# Patient Record
Sex: Female | Born: 1975 | Race: White | Hispanic: No | Marital: Married | Smoking: Never smoker
Health system: Southern US, Community
[De-identification: ages and names within clinical notes are randomized; demographics above are authoritative.]

## PROBLEM LIST (undated history)

## (undated) DIAGNOSIS — G7000 Myasthenia gravis without (acute) exacerbation: Secondary | ICD-10-CM

## (undated) DIAGNOSIS — R87629 Unspecified abnormal cytological findings in specimens from vagina: Secondary | ICD-10-CM

## (undated) DIAGNOSIS — N6320 Unspecified lump in the left breast, unspecified quadrant: Secondary | ICD-10-CM

## (undated) DIAGNOSIS — Z86018 Personal history of other benign neoplasm: Secondary | ICD-10-CM

## (undated) HISTORY — DX: Unspecified lump in the left breast, unspecified quadrant: N63.20

## (undated) HISTORY — PX: BREAST BIOPSY: SHX20

## (undated) HISTORY — DX: Myasthenia gravis without (acute) exacerbation: G70.00

## (undated) HISTORY — DX: Unspecified abnormal cytological findings in specimens from vagina: R87.629

---
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
## 1997-05-07 HISTORY — PX: BREAST SURGERY: SHX581

## 1997-05-07 HISTORY — PX: BREAST EXCISIONAL BIOPSY: SUR124

## 2004-12-25 ENCOUNTER — Observation Stay: Payer: Self-pay | Admitting: Obstetrics and Gynecology

## 2005-01-12 ENCOUNTER — Inpatient Hospital Stay: Payer: Self-pay | Admitting: Obstetrics and Gynecology

## 2005-01-12 DIAGNOSIS — O321XX0 Maternal care for breech presentation, not applicable or unspecified: Secondary | ICD-10-CM

## 2005-05-07 DIAGNOSIS — G7000 Myasthenia gravis without (acute) exacerbation: Secondary | ICD-10-CM

## 2005-05-07 HISTORY — DX: Myasthenia gravis without (acute) exacerbation: G70.00

## 2005-05-07 HISTORY — PX: OTHER SURGICAL HISTORY: SHX169

## 2005-05-07 HISTORY — PX: THYMECTOMY: SHX1063

## 2005-05-15 ENCOUNTER — Ambulatory Visit: Payer: Self-pay

## 2005-05-21 ENCOUNTER — Ambulatory Visit: Payer: Self-pay | Admitting: Ophthalmology

## 2005-10-02 ENCOUNTER — Ambulatory Visit: Payer: Self-pay | Admitting: Internal Medicine

## 2007-01-16 ENCOUNTER — Encounter: Payer: Self-pay | Admitting: Obstetrics and Gynecology

## 2007-05-05 ENCOUNTER — Encounter: Payer: Self-pay | Admitting: Obstetrics and Gynecology

## 2007-06-12 ENCOUNTER — Encounter: Payer: Self-pay | Admitting: Maternal and Fetal Medicine

## 2007-06-19 ENCOUNTER — Observation Stay: Payer: Self-pay

## 2007-06-22 ENCOUNTER — Observation Stay: Payer: Self-pay

## 2007-06-23 ENCOUNTER — Observation Stay: Payer: Self-pay

## 2007-06-26 ENCOUNTER — Observation Stay: Payer: Self-pay

## 2007-06-27 ENCOUNTER — Ambulatory Visit: Payer: Self-pay | Admitting: Obstetrics and Gynecology

## 2007-06-29 ENCOUNTER — Inpatient Hospital Stay: Payer: Self-pay | Admitting: Obstetrics and Gynecology

## 2007-11-12 ENCOUNTER — Emergency Department: Payer: Self-pay | Admitting: Emergency Medicine

## 2007-11-12 ENCOUNTER — Other Ambulatory Visit: Payer: Self-pay

## 2008-03-03 ENCOUNTER — Ambulatory Visit: Payer: Self-pay | Admitting: Internal Medicine

## 2010-10-04 ENCOUNTER — Ambulatory Visit: Payer: Self-pay | Admitting: Family Medicine

## 2011-10-28 DIAGNOSIS — G7000 Myasthenia gravis without (acute) exacerbation: Secondary | ICD-10-CM | POA: Insufficient documentation

## 2012-04-17 DIAGNOSIS — M542 Cervicalgia: Secondary | ICD-10-CM | POA: Insufficient documentation

## 2012-04-17 DIAGNOSIS — M5412 Radiculopathy, cervical region: Secondary | ICD-10-CM | POA: Insufficient documentation

## 2012-11-25 ENCOUNTER — Ambulatory Visit: Payer: Self-pay | Admitting: Obstetrics and Gynecology

## 2012-12-03 ENCOUNTER — Ambulatory Visit (INDEPENDENT_AMBULATORY_CARE_PROVIDER_SITE_OTHER): Payer: 59 | Admitting: General Surgery

## 2012-12-03 ENCOUNTER — Encounter: Payer: Self-pay | Admitting: General Surgery

## 2012-12-03 VITALS — BP 120/74 | HR 76 | Resp 12 | Ht 63.0 in | Wt 147.0 lb

## 2012-12-03 DIAGNOSIS — N6009 Solitary cyst of unspecified breast: Secondary | ICD-10-CM | POA: Insufficient documentation

## 2012-12-03 DIAGNOSIS — N6002 Solitary cyst of left breast: Secondary | ICD-10-CM

## 2012-12-03 NOTE — Patient Instructions (Signed)
Call if any changes or worsening of symptoms.

## 2012-12-03 NOTE — Progress Notes (Signed)
Patient ID: Julie Skinner, female   DOB: October 23, 1975, 37 y.o.   MRN: [id number]  Chief Complaint  Patient presents with  . Other    mammogram    HPI Julie Skinner is a 37 y.o. female who presents for a breast evaluation. The most recent mammogram was done on 11/25/12. Patient does perform regular self breast checks . Patient felt some pain in the left breast about 2 weeks ago and was seen by her doctor for this. The breast was sore to the touch and felt somewhat denser. She denies any drainage from the breasts. She has a history of previous lump removed more than 15 years ago from the left breast that was benign.  Is no history of trauma.    HPI  Past Medical History  Diagnosis Date  . Myasthenia gravis 2007    Past Surgical History  Procedure Laterality Date  . Cesarean section  [id number]  . Breast surgery Left 1999    lump removed  . Thymectomy  2007    No family history on file.  Social History History  Substance Use Topics  . Smoking status: Never Smoker   . Smokeless tobacco: Never Used  . Alcohol Use: Yes    No Known Allergies  Current Outpatient Prescriptions  Medication Sig Dispense Refill  . cholecalciferol (VITAMIN D) 1000 UNITS tablet Take 1,000 Units by mouth daily.      . Cyanocobalamin (B-12) 100 MCG TABS Take 1 tablet by mouth daily.      . Multiple Vitamins-Minerals (MULTIVITAMIN WITH MINERALS) tablet Take 1 tablet by mouth daily.       No current facility-administered medications for this visit.    Review of Systems Review of Systems  Constitutional: Negative.   Respiratory: Negative.   Cardiovascular: Negative.     Blood pressure 120/74, pulse 76, resp. rate 12, height 5\' 3"  (1.6 m), weight 147 lb (66.679 kg).  Physical Exam Physical Exam  Constitutional: She is oriented to person, place, and time. She appears well-developed and well-nourished.  Neck: Neck supple.  Cardiovascular: Normal rate and regular rhythm.   Pulmonary/Chest:  Effort normal and breath sounds normal. Right breast exhibits no inverted nipple, no mass, no nipple discharge, no skin change and no tenderness. Left breast exhibits no inverted nipple, no mass, no nipple discharge, no skin change and no tenderness.  Lymphadenopathy:    She has no cervical adenopathy.  Neurological: She is alert and oriented to person, place, and time.    Data Reviewed Bilateral mammograms dated Oct 04, 2010 were reviewed. Moderately dense breasts. No mass or significant microcalcifications. BI-RAD-2. Bilateral mammogram dated November 25, 2012 showed heterogeneously dense breasts. Multiple small nodules were appreciated bilaterally. Focal spot compression views of the left breast showed a nodule with rounded borders which on retrospect appears unchanged from her 2012 exam. No areas of suspicious microcalcifications.  Ultrasound of the same date showed multiple cysts measuring up to 0.8 cm in diameter. A single hypoechoic nodule which was felt to be indeterminate and felt to correspond to the mammographic abnormality measuring 0.6 cm was noted at the 3:00 position.  The films were reviewed and the small nodular density in the lateral aspect of the left breast appears unchanged since 2012.  GYN note status November 25, 2012 were reviewed.  Assessment    Multiple small breast cyst, now asymptomatic. Small 6 mm nodule, likely fibroadenoma.     Plan    The rest pain which prompted the patient's assessment has essentially resolved  in the last 7-10 days. Her clinical exam is unremarkable. Mammogram showed no discernible change and a small nodular density.  Options for management were reviewed: 1) early biopsy versus 2) observation. At this time the patient is comfortable with observation. Should she have recurrent pain or desire early biopsy she'll contact the office. Otherwise, plans will be for followup in 6 months with plans for a repeat in office ultrasound at that time.that time.        Julie Skinner 12/03/2012, 9:52 PM

## 2012-12-17 ENCOUNTER — Ambulatory Visit: Payer: Self-pay

## 2013-06-11 ENCOUNTER — Ambulatory Visit: Payer: 59 | Admitting: General Surgery

## 2013-06-15 ENCOUNTER — Encounter: Payer: Self-pay | Admitting: General Surgery

## 2013-06-15 ENCOUNTER — Other Ambulatory Visit: Payer: 59

## 2013-06-15 ENCOUNTER — Ambulatory Visit (INDEPENDENT_AMBULATORY_CARE_PROVIDER_SITE_OTHER): Payer: 59 | Admitting: General Surgery

## 2013-06-15 VITALS — BP 124/84 | HR 76 | Resp 12 | Ht 63.0 in | Wt 156.0 lb

## 2013-06-15 DIAGNOSIS — N63 Unspecified lump in unspecified breast: Secondary | ICD-10-CM

## 2013-06-15 NOTE — Patient Instructions (Addendum)
Patient to return in 6 months for a follow up with bilateral diagnostic mammogram .

## 2013-06-15 NOTE — Progress Notes (Signed)
Patient ID: Julie Skinner, female   DOB: 11/07/1975, 38 y.o.   MRN: [id number]  Chief Complaint  Patient presents with  . Follow-up    breast ultrasound    HPI Julie Skinner is a 38 y.o. female here today for her six month left breast ultrasound. Patient states she is doing well.   HPI  Past Medical History  Diagnosis Date  . Myasthenia gravis 2007    Past Surgical History  Procedure Laterality Date  . Cesarean section  [id number]  . Breast surgery Left 1999    lump removed  . Thymectomy  2007    History reviewed. No pertinent family history.  Social History History  Substance Use Topics  . Smoking status: Never Smoker   . Smokeless tobacco: Never Used  . Alcohol Use: Yes    No Known Allergies  Current Outpatient Prescriptions  Medication Sig Dispense Refill  . cholecalciferol (VITAMIN D) 1000 UNITS tablet Take 1,000 Units by mouth daily.      . ciprofloxacin (CIPRO) 500 MG tablet Take 1 tablet by mouth daily.      . Cyanocobalamin (B-12) 100 MCG TABS Take 1 tablet by mouth daily.      Marland Kitchen escitalopram (LEXAPRO) 10 MG tablet Take 10 mg by mouth daily.      . metroNIDAZOLE (METROGEL) 0.75 % vaginal gel       . Multiple Vitamins-Minerals (MULTIVITAMIN WITH MINERALS) tablet Take 1 tablet by mouth daily.       No current facility-administered medications for this visit.    Review of Systems Review of Systems  Constitutional: Negative.   Respiratory: Negative.   Cardiovascular: Negative.     Blood pressure 124/84, pulse 76, resp. rate 12, height 5\' 3"  (1.6 m), weight 156 lb (70.761 kg).  Physical Exam Physical Exam  Constitutional: She is oriented to person, place, and time. She appears well-developed and well-nourished.  Neck: Neck supple. No thyromegaly present.  Cardiovascular: Normal rate, regular rhythm and normal heart sounds.   No murmur heard. Pulmonary/Chest: Effort normal and breath sounds normal. Right breast exhibits no inverted nipple, no  mass, no nipple discharge, no skin change and no tenderness. Left breast exhibits no inverted nipple, no mass, no nipple discharge, no skin change and no tenderness.  Lymphadenopathy:    She has no cervical adenopathy.    She has no axillary adenopathy.  Neurological: She is alert and oriented to person, place, and time.  Skin: Skin is warm and dry.    Data Reviewed Ultrasound examination of the left breast in the 3:00 position, 10 cm from nipple once again identifies a hypoechoic 0.5 x 0.64 x 0.81 cm lesion. No vascular flow was identified. Her previous hospital-based ultrasound suggested a maximum diameter of 0.6 cm.  Assessment    Solid nodule right breast, minimal interval change considering observer variance.     Plan    Options for management were reviewed: 1) careful followup with bilateral diagnostic mammograms an office ultrasound in 6 months versus 2) early vacuum-assisted biopsy. Pros and cons of each were reviewed. At this time the patient is comfortable with continued observation.        Robert Bellow 06/15/2013, 9:13 PM

## 2013-06-23 ENCOUNTER — Ambulatory Visit: Payer: Self-pay | Admitting: Obstetrics and Gynecology

## 2013-12-24 ENCOUNTER — Encounter: Payer: Self-pay | Admitting: General Surgery

## 2013-12-28 ENCOUNTER — Ambulatory Visit: Payer: 59 | Admitting: General Surgery

## 2013-12-30 ENCOUNTER — Ambulatory Visit (INDEPENDENT_AMBULATORY_CARE_PROVIDER_SITE_OTHER): Payer: 59 | Admitting: General Surgery

## 2013-12-30 ENCOUNTER — Other Ambulatory Visit: Payer: 59

## 2013-12-30 ENCOUNTER — Encounter: Payer: Self-pay | Admitting: General Surgery

## 2013-12-30 VITALS — BP 120/80 | HR 78 | Resp 12 | Ht 64.0 in | Wt 149.0 lb

## 2013-12-30 DIAGNOSIS — N6320 Unspecified lump in the left breast, unspecified quadrant: Secondary | ICD-10-CM

## 2013-12-30 DIAGNOSIS — N63 Unspecified lump in unspecified breast: Secondary | ICD-10-CM

## 2013-12-30 NOTE — Patient Instructions (Signed)

## 2013-12-30 NOTE — Progress Notes (Signed)
Patient ID: Julie Skinner, female   DOB: 1976/02/14, 38 y.o.   MRN: [id number]  Chief Complaint  Patient presents with  . Follow-up    mammogram    HPI Julie Skinner is a 38 y.o. female who presents for a breast evaluation. The most recent mammogram and left breast ultrasound was done on 12/22/13 at [facility].  Patient does perform regular self breast checks and gets regular mammograms done.    HPI  Past Medical History  Diagnosis Date  . Myasthenia gravis 2007    Past Surgical History  Procedure Laterality Date  . Cesarean section  [id number]  . Breast surgery Left 1999    lump removed  . Thymectomy  2007    No family history on file.  Social History History  Substance Use Topics  . Smoking status: Never Smoker   . Smokeless tobacco: Never Used  . Alcohol Use: Yes    No Known Allergies  Current Outpatient Prescriptions  Medication Sig Dispense Refill  . cholecalciferol (VITAMIN D) 1000 UNITS tablet Take 1,000 Units by mouth daily.      . ciprofloxacin (CIPRO) 500 MG tablet Take 1 tablet by mouth daily.      . Cyanocobalamin (B-12) 100 MCG TABS Take 1 tablet by mouth daily.      Marland Kitchen escitalopram (LEXAPRO) 10 MG tablet Take 10 mg by mouth daily.      Marland Kitchen gabapentin (NEURONTIN) 300 MG capsule Take 300 mg by mouth 3 (three) times daily.      . metroNIDAZOLE (METROGEL) 0.75 % vaginal gel       . Multiple Vitamins-Minerals (MULTIVITAMIN WITH MINERALS) tablet Take 1 tablet by mouth daily.       No current facility-administered medications for this visit.    Review of Systems Review of Systems  Constitutional: Negative.   Respiratory: Negative.   Cardiovascular: Negative.     Blood pressure 120/80, pulse 78, resp. rate 12, height 5\' 4"  (1.626 m), weight 149 lb (67.586 kg).  Physical Exam Physical Exam  Constitutional: She is oriented to person, place, and time. She appears well-developed and well-nourished.  Eyes: Conjunctivae are normal. No scleral icterus.   Neck: Neck supple.  Cardiovascular: Normal rate, regular rhythm and normal heart sounds.   Pulmonary/Chest: Effort normal and breath sounds normal. Right breast exhibits no inverted nipple, no mass, no nipple discharge, no skin change and no tenderness. Left breast exhibits no inverted nipple, no nipple discharge, no skin change and no tenderness.  Lymphadenopathy:    She has no cervical adenopathy.    She has no axillary adenopathy.  Neurological: She is alert and oriented to person, place, and time.  Skin: Skin is warm and dry.    Data Reviewed Bilateral mammograms completed at [facility] dated December 22, 2013 as well as supplemental ultrasound images were reviewed. The area previous identified in the 3 o'clock position of the left breast has shown a modest increase in size since her early 2015 exam. BI-RAD-4.  Ultrasound examination of the left breast in the 3 o'clock position 10 cm nipple showed a 0.5 x 0.8 x 1.0 cm ellipticals, homogeneous hypoechoic mass. There has been a proximally a 1-2 mm increase in size since her spring 2015 exam.  Reviewing the options for management the patient was amenable to biopsy. 10 cc of 0.5% Xylocaine with 0.25% Marcaine with 1-200,000 units of epinephrine was utilized to well-tolerated. A 10-gauge Encor device was placed under direct vision. A core samples were obtained with near  complete removal of the lesion. A postbiopsy clip was placed. Skin defect was closed with benzoin and Steri-Strip.  The patient tolerated the procedure well and was provided with written instructions regards to wound care.  Assessment    Fibroadenoma of the left breast.     Plan    The patient will be contacted when biopsy results are available. She'll follow up with the nurse in one week. If benign results are identified we'll plan for a one year followup with screening mammograms at that time.     PCP: No Pcp Per Patient    Robert Bellow 12/30/2013, 8:37  PM

## 2014-01-01 ENCOUNTER — Telehealth: Payer: Self-pay | Admitting: General Surgery

## 2014-01-01 LAB — PATHOLOGY

## 2014-01-01 NOTE — Telephone Encounter (Signed)
The patient was notified that the recently completed left breast biopsy confirmed the clinical impression of a fibroadenoma.  She reports that she had moderate pain the day after the procedure, but is significantly better today.  She'll return next week for staff wound evaluation is requested.

## 2014-01-06 ENCOUNTER — Ambulatory Visit: Payer: 59

## 2014-01-06 ENCOUNTER — Telehealth: Payer: Self-pay | Admitting: *Deleted

## 2014-01-06 NOTE — Telephone Encounter (Signed)
I talked with the patient and she says she is doing well. The area has no bruising. Aware she can use a heating pad for comfort as needed. Follow up in one year with screening mammograms.

## 2014-01-07 ENCOUNTER — Ambulatory Visit: Payer: 59

## 2014-03-08 ENCOUNTER — Encounter: Payer: Self-pay | Admitting: General Surgery

## 2014-07-07 DIAGNOSIS — E663 Overweight: Secondary | ICD-10-CM | POA: Insufficient documentation

## 2014-08-07 DIAGNOSIS — E663 Overweight: Secondary | ICD-10-CM

## 2014-10-14 ENCOUNTER — Ambulatory Visit (INDEPENDENT_AMBULATORY_CARE_PROVIDER_SITE_OTHER): Payer: 59 | Admitting: Obstetrics and Gynecology

## 2014-10-14 ENCOUNTER — Encounter: Payer: Self-pay | Admitting: Obstetrics and Gynecology

## 2014-10-14 VITALS — BP 118/82 | HR 78 | Ht 64.0 in | Wt 142.5 lb

## 2014-10-14 DIAGNOSIS — Z0000 Encounter for general adult medical examination without abnormal findings: Secondary | ICD-10-CM | POA: Diagnosis not present

## 2014-10-14 LAB — HM PAP SMEAR: HM PAP: NEGATIVE

## 2014-10-14 NOTE — Progress Notes (Signed)
  Subjective:     Julie Skinner is a 39 y.o. female here for a routine exam.  Current complaints: none.  Personal health questionnaire reviewed: yes.   Gynecologic History No LMP recorded. Patient is not currently having periods (Reason: IUD). Contraception: IUD Last Pap: 2013. Results were: normal Last mammogram: 2015. Results were: abnormal  Obstetric History OB History  Gravida Para Term Preterm AB SAB TAB Ectopic Multiple Living  2 2        2     # Outcome Date GA Lbr Len/2nd Weight Sex Delivery Anes PTL Lv  2 Para           1 Para             Obstetric Comments  1st Menstrual Cycle: 10  1st Pregnancy:  29     The following portions of the patient's history were reviewed and updated as appropriate: allergies, current medications, past family history, past social history, past surgical history and problem list.  Review of Systems A comprehensive review of systems was negative.    Objective:    BP 118/82 mmHg  Pulse 78  Ht 5\' 4"  (1.626 m)  Wt 142 lb 8 oz (64.638 kg)  BMI 24.45 kg/m2  General Appearance:    Alert, cooperative, no distress, appears stated age  Head:    Normocephalic, without obvious abnormality, atraumatic  Eyes:    PERRL, conjunctiva/corneas clear, EOM's intact, fundi    benign, both eyes  Ears:    Normal TM's and external ear canals, both ears  Nose:   Nares normal, septum midline, mucosa normal, no drainage    or sinus tenderness  Throat:   Lips, mucosa, and tongue normal; teeth and gums normal  Neck:   Supple, symmetrical, trachea midline, no adenopathy;    thyroid:  no enlargement/tenderness/nodules; no carotid   bruit or JVD  Back:     Symmetric, no curvature, ROM normal, no CVA tenderness  Lungs:     Clear to auscultation bilaterally, respirations unlabored  Chest Wall:    No tenderness or deformity   Heart:    Regular rate and rhythm, S1 and S2 normal, no murmur, rub   or gallop  Breast Exam:    No tenderness, masses, or nipple  abnormality  Abdomen:     Soft, non-tender, bowel sounds active all four quadrants,    no masses, no organomegaly  Genitalia:    Normal female without lesion, discharge or tenderness  Rectal:    Normal tone, normal prostate, no masses or tenderness;   guaiac negative stool  Extremities:   Extremities normal, atraumatic, no cyanosis or edema  Pulses:   2+ and symmetric all extremities  Skin:   Skin color, texture, turgor normal, no rashes or lesions  Lymph nodes:   Cervical, supraclavicular, and axillary nodes normal  Neurologic:   CNII-XII intact, normal strength, sensation and reflexes    throughout      Assessment:    Healthy female exam.    Plan:    Follow up in: 1 year.

## 2014-10-14 NOTE — Patient Instructions (Signed)
Place premenopausal annual exam patient instructions here.  °

## 2014-10-15 ENCOUNTER — Other Ambulatory Visit: Payer: Self-pay | Admitting: Obstetrics and Gynecology

## 2014-10-15 DIAGNOSIS — Z0000 Encounter for general adult medical examination without abnormal findings: Secondary | ICD-10-CM

## 2014-10-20 ENCOUNTER — Encounter: Payer: Self-pay | Admitting: *Deleted

## 2014-10-20 LAB — PAPIG, HPV, RFX 16/18
HPV, high-risk: NEGATIVE
PAP SMEAR COMMENT: 0

## 2014-10-20 NOTE — Progress Notes (Signed)
Quick Note:  Please let her know her pap and HPV were both negative, will need repeat in 4 years ______

## 2014-11-24 ENCOUNTER — Other Ambulatory Visit: Payer: Self-pay

## 2014-11-24 DIAGNOSIS — Z1231 Encounter for screening mammogram for malignant neoplasm of breast: Secondary | ICD-10-CM

## 2014-12-30 DIAGNOSIS — Z85828 Personal history of other malignant neoplasm of skin: Secondary | ICD-10-CM

## 2014-12-30 HISTORY — DX: Personal history of other malignant neoplasm of skin: Z85.828

## 2015-01-25 ENCOUNTER — Encounter: Payer: Self-pay | Admitting: General Surgery

## 2015-01-25 ENCOUNTER — Ambulatory Visit (INDEPENDENT_AMBULATORY_CARE_PROVIDER_SITE_OTHER): Payer: 59 | Admitting: General Surgery

## 2015-01-25 VITALS — BP 118/64 | HR 78 | Resp 12 | Ht 64.0 in | Wt 148.0 lb

## 2015-01-25 DIAGNOSIS — N6320 Unspecified lump in the left breast, unspecified quadrant: Secondary | ICD-10-CM

## 2015-01-25 DIAGNOSIS — D249 Benign neoplasm of unspecified breast: Secondary | ICD-10-CM | POA: Insufficient documentation

## 2015-01-25 DIAGNOSIS — D242 Benign neoplasm of left breast: Secondary | ICD-10-CM

## 2015-01-25 DIAGNOSIS — N63 Unspecified lump in breast: Secondary | ICD-10-CM | POA: Diagnosis not present

## 2015-01-25 NOTE — Progress Notes (Signed)
Patient ID: Julie Skinner, female   DOB: 1975-07-14, 39 y.o.   MRN: [id number]  Chief Complaint  Patient presents with  . Follow-up    mammogram    HPI Julie Skinner is a 39 y.o. female.  who presents for a breast evaluation. The most recent mammogram was done on 01-18-15.  Patient does perform regular self breast checks and gets regular mammograms done.   No new breast complaints.  HPI  Past Medical History  Diagnosis Date  . Myasthenia gravis 2007  . Left breast lump   . Myasthenia gravis   . Vaginal Pap smear, abnormal     Past Surgical History  Procedure Laterality Date  . Cesarean section  [id number]  . Breast surgery Left 1999    lump removed  . Thymectomy  2007  . Thymetomy  2007    Family History  Problem Relation Age of Onset  . Heart disease Mother   . Hypertension Mother   . Heart disease Father   . Hypertension Father   . Heart disease Maternal Grandmother   . Cancer Maternal Grandfather     breast    Social History Social History  Substance Use Topics  . Smoking status: Never Smoker   . Smokeless tobacco: Never Used  . Alcohol Use: Yes     Comment: occas    No Known Allergies  Current Outpatient Prescriptions  Medication Sig Dispense Refill  . cholecalciferol (VITAMIN D) 1000 UNITS tablet Take 1,000 Units by mouth as needed.     . gabapentin (NEURONTIN) 300 MG capsule Take 300 mg by mouth as needed.     . metroNIDAZOLE (METROGEL) 0.75 % vaginal gel Place 1 Applicatorful vaginally as needed.     . traMADol (ULTRAM) 50 MG tablet Take 50 mg by mouth every 6 (six) hours as needed.     No current facility-administered medications for this visit.    Review of Systems Review of Systems  Constitutional: Negative.   Respiratory: Negative.   Cardiovascular: Negative.     Blood pressure 118/64, pulse 78, resp. rate 12, height 5\' 4"  (1.626 m), weight 148 lb (67.132 kg).  Physical Exam Physical Exam  Constitutional: She is oriented to  person, place, and time. She appears well-developed and well-nourished.  HENT:  Mouth/Throat: Oropharynx is clear and moist.  Eyes: Conjunctivae are normal. No scleral icterus.  Neck: Neck supple.  Cardiovascular: Normal rate, regular rhythm and normal heart sounds.   Pulmonary/Chest: Effort normal and breath sounds normal. Right breast exhibits no inverted nipple, no mass, no nipple discharge, no skin change and no tenderness. Left breast exhibits no inverted nipple, no mass, no nipple discharge, no skin change and no tenderness.  Lymphadenopathy:    She has no cervical adenopathy.    She has no axillary adenopathy.  Neurological: She is alert and oriented to person, place, and time.  Skin: Skin is warm and dry.  Psychiatric: Her behavior is normal.    Data Reviewed Bilateral screening mammogram dated 01/18/2015 completed at [facility] radiology showed the previously noted nodule in the left breast resolved with a biopsy clip in place. BI-RADS-2.  Diagnosis: LEFT BREAST 3:00: - FIBROADENOMA. - NEGATIVE FOR ATYPIA AND MALIGNANCY. NOTE: The results of this case were discussed with Dr. Bary Castilla on January 01, 2014 by Dr. Luana Shu.  Assessment    Doing well status post removal left breast fibroadenoma. Unremarkable breast exam.    Plan    Annual screening mammograms to be arranged with her OB/GYN  PCP.     Follow up as needed.   PCP:  No Pcp Per Patient GYN: Julie Skinner 01/25/2015, 8:49 PM

## 2015-01-25 NOTE — Patient Instructions (Signed)
Continue self breast exams. Call office for any new breast issues or concerns. 

## 2015-05-04 ENCOUNTER — Other Ambulatory Visit (INDEPENDENT_AMBULATORY_CARE_PROVIDER_SITE_OTHER): Payer: 59

## 2015-05-04 ENCOUNTER — Ambulatory Visit (INDEPENDENT_AMBULATORY_CARE_PROVIDER_SITE_OTHER): Payer: 59 | Admitting: Obstetrics and Gynecology

## 2015-05-04 ENCOUNTER — Encounter: Payer: Self-pay | Admitting: Obstetrics and Gynecology

## 2015-05-04 VITALS — BP 125/76 | HR 81 | Ht 64.0 in | Wt 152.2 lb

## 2015-05-04 DIAGNOSIS — Z975 Presence of (intrauterine) contraceptive device: Principal | ICD-10-CM

## 2015-05-04 DIAGNOSIS — N921 Excessive and frequent menstruation with irregular cycle: Secondary | ICD-10-CM | POA: Diagnosis not present

## 2015-05-04 NOTE — Progress Notes (Signed)
Subjective:     Patient ID: Julie Skinner, female   DOB: [birth year], 39 y.o.   MRN: [id number]  HPI Reports onset of painless vaginal spotting x 5 days. Has IUD in place for about 2 years with no menses. Denies any recent strenous activity but does feel like string might be a little longer.  Review of Systems See above    Objective:   Physical Exam A&O x4 Well groomed female in no distress Pelvic exam: normal external genitalia, vulva, vagina, cervix, uterus and adnexa, IUD string noted  Ultrasound Findings:  The uterus measures 7.9 x 3.4 x 4.8 cm Echo texture is homogenous without evidence of focal masses. Within the uterus is a linear, echogenic area representing the Mirena IUD. It appears to be just slightly lower in the uterine fundus, but still within the endometrial cavity.    The Endometrium measures 2.3 mm.  Right Ovary measures 2.4 x 1.5 x 1.8 cm. It is normal in appearance. Left Ovary measures 1.9 x 1.3 x 1.2 cm. It is normal appearance. Survey of the adnexa demonstrates no adnexal masses. There is no free fluid in the cul de sac.  Impression: 1. Uterus with Mirena IUD seen. IUD is slightly low, but still within the endometrial canal. 2. Ovaries and adnexa appear WNL. Marland Kitchen    Assessment:     BTB with Mirena     Plan:     Desires to continue with mirena at this time. Will let me know if bleeding continues or worsens or if desires placing a new one.  Melody Bolton, CNM

## 2015-06-03 ENCOUNTER — Ambulatory Visit (INDEPENDENT_AMBULATORY_CARE_PROVIDER_SITE_OTHER): Payer: 59 | Admitting: Obstetrics and Gynecology

## 2015-06-03 ENCOUNTER — Encounter: Payer: Self-pay | Admitting: Obstetrics and Gynecology

## 2015-06-03 VITALS — BP 104/68 | HR 88 | Ht 63.5 in | Wt 152.5 lb

## 2015-06-03 DIAGNOSIS — Z30433 Encounter for removal and reinsertion of intrauterine contraceptive device: Secondary | ICD-10-CM

## 2015-06-03 DIAGNOSIS — E663 Overweight: Secondary | ICD-10-CM | POA: Diagnosis not present

## 2015-06-03 MED ORDER — PHENTERMINE HCL 37.5 MG PO TABS: 37.5000 mg | ORAL_TABLET | Freq: Every day | ORAL | Status: DC

## 2015-06-03 MED ORDER — CYANOCOBALAMIN 1000 MCG/ML IJ SOLN: 1000.0000 ug | Freq: Once | INTRAMUSCULAR | Status: DC

## 2015-06-03 NOTE — Progress Notes (Signed)
Julie Skinner is a 40 y.o. year old G17P2 Caucasian female who presents for removal and replacement of a Mirena IUD. She was given informed consent for removal and reinsertion of her Mirena. Her Mirena was placed 2014, No LMP recorded. Patient is not currently having periods (Reason: IUD)., We are replacing it early due to irregular bleeding.   The risks and benefits of the method and placement have been thouroughly reviewed with the patient and all questions were answered.  Specifically the patient is aware of failure rate of 05/998, expulsion of the IUD and of possible perforation.  The patient is aware of irregular bleeding due to the method and understands the incidence of irregular bleeding diminishes with time.  Signed copy of informed consent in chart.   No LMP recorded. Patient is not currently having periods (Reason: IUD). BP 104/68 mmHg  Pulse 88  Ht 5' 3.5" (1.613 m)  Wt 152 lb 8 oz (69.174 kg)  BMI 26.59 kg/m2 No results found for this or any previous visit (from the past 24 hour(s)).   Appropriate time out taken. A pederson speculum was placed in the vagina.  The cervix was visualized, prepped using Betadine. The strings were visible. They were grasped and the Mirena was easily removed. The cervix was then grasped with a single-tooth tenaculum. The uterus was found to be anteroflexed and it sounded to 7 cm.  Mirena IUD placed per manufacturer's recommendations without complications. The strings were trimmed to 3 cm.  The patient tolerated the procedure well.    The patient was given post procedure instructions, including signs and symptoms of infection and to check for the strings after each menses or each month, and refraining from intercourse or anything in the vagina for 3 days.  She was given a Mirena care card with date Mirena placed, and date Mirena to be removed.  Also desires restarting Adipex and B12- injection given today and will return in 4 weeks for  wt/bp/B12.  Kendle Erker Rockney Ghee, CNM

## 2015-06-03 NOTE — Patient Instructions (Signed)

## 2015-06-23 ENCOUNTER — Ambulatory Visit: Payer: 59 | Attending: Orthopedic Surgery | Admitting: Physical Therapy

## 2015-06-23 ENCOUNTER — Encounter: Payer: Self-pay | Admitting: Physical Therapy

## 2015-06-23 DIAGNOSIS — S83512A Sprain of anterior cruciate ligament of left knee, initial encounter: Secondary | ICD-10-CM | POA: Insufficient documentation

## 2015-06-23 DIAGNOSIS — M6281 Muscle weakness (generalized): Secondary | ICD-10-CM | POA: Insufficient documentation

## 2015-06-23 DIAGNOSIS — X58XXXA Exposure to other specified factors, initial encounter: Secondary | ICD-10-CM | POA: Diagnosis not present

## 2015-06-23 NOTE — Therapy (Signed)
[facility] [address] [place], [address] Phone: [phone]   Fax:  [phone]  Physical Therapy Evaluation  Patient Details  Name: Julie Skinner MRN: [id number] Date of Birth: 1976/01/15 Referring Provider: Kelli Hope, MD  Encounter Date: 06/23/2015      PT End of Session - 06/23/15 1137    Visit Number 1   Number of Visits 17   Date for PT Re-Evaluation 08/04/15   PT Start Time 1010   PT Stop Time 1100   PT Time Calculation (min) 50 min   Equipment Utilized During Treatment Left knee immobilizer   Activity Tolerance Patient tolerated treatment well   Behavior During Therapy [facility] for tasks assessed/performed      Past Medical History  Diagnosis Date  . Myasthenia gravis ([place]) 2007  . Left breast lump   . Myasthenia gravis ([place])   . Vaginal Pap smear, abnormal     Past Surgical History  Procedure Laterality Date  . Cesarean section  [id number]  . Breast surgery Left 1999    lump removed  . Thymectomy  2007  . Thymetomy  2007    There were no vitals filed for this visit.  Visit Diagnosis:  Left ACL tear, initial encounter  Muscle weakness      Subjective Assessment - 06/23/15 1106    Subjective Pt reports minor pain and soreness in L post knee due to s/p L ACL reconstruction.  Pt states she injured her knee after falling on ice in the beginning of January.     Pertinent History s/p L ACL reconstruction in early january   Limitations Sitting;Lifting;Standing;Walking;House hold activities   Patient Stated Goals Jogging, skiing, and performing ADLs normally   Currently in Pain? Yes   Pain Location Knee   Pain Orientation Left;Posterior   Pain Onset More than a month ago   Pain Frequency Intermittent   Aggravating Factors  flex ande ext knee   Multiple Pain Sites No            [facility] PT Assessment - 06/23/15 0001    Assessment   Medical Diagnosis [place] of ACL of L  knee; [place] of MCL of L knee   Referring Provider Kelli Hope, MD   Onset Date/Surgical Date 05/14/15   Prior Therapy At hospital   Precautions   Precaution Comments Pt to continue to wear brace locked at 60 deg flex until able to perform 10 SLR without quad lag   Balance Screen   Has the patient fallen in the past 6 months Yes   How many times? 1   Has the patient had a decrease in activity level because of a fear of falling?  Yes   Is the patient reluctant to leave their home because of a fear of falling?  No   Home Social worker Private residence   Living Arrangements Spouse/significant other;Children   Available Help at Discharge Family   Type of [place] to enter   Entrance Stairs-Number of Steps 3   Entrance Stairs-Rails None   Home Layout Two level   Prior Function   Level of Independence Independent   Vocation Full time employment   Observation/Other Assessments   Skin Integrity Bandages over incisions, bruising noted on ankle and med shin, elevated scabbing on the post aspect of distal L LE where hamstring graft was taken, minor swelling in L knee   ROM /  Strength   AROM / PROM / Strength AROM   AROM   Overall AROM Comments L knee flex/ext: 83/+2 hyperext   Palpation   Patella mobility L patella able to be mobilized in all directions with minimal hypomobility   Palpation comment No tenderness to palpation in L knee and gastroc   Ambulation/Gait   Gait Comments Hip hike on R with limited L knee flex      Objective:  1x5 quad sets to activate/strengthen L quad.  Pt had difficulty initially, but with verbal and tactile cuing improved although not able to fully contract. 1x10 SLR with L LE to strengthen quad and progress toward ambulation without brace.  Quad lag observed throughout exercise, but improved with cuing to fully contract quad before lifting leg. 3x30 sec calf stretch in long sitting with blue TB to incr mobility  of L LE and allow improved ambulation and ability to perform ADLs.  This exercise proved to be the least difficult for the pt with no cuing needed. 3 min patellar mobilizations performed by PT in all directions to prevent hypomobility in patella and allow incr ROM in L knee. Gait assessment - hip hike on R possibly due to brace on L causing reverse Trendelenburg; decr flex on L                     PT Education - 06/23/15 1136    Education provided Yes   Education Details Pt educated on HEP assigned by doctor and to ensure proper quad contraction during SLR exercise   Person(s) Educated Patient   Methods Demonstration;Explanation;Tactile cues;Verbal cues   Comprehension Returned demonstration;Verbalized understanding             PT Long Term Goals - 06/23/15 1156    PT LONG TERM GOAL #1   Title Pt will be able to flex L knee at least the same amount as the R to allow normal gait and ability to perform ADLs   Baseline 83 deg active flex   Time 8   Period Weeks   Status New   PT LONG TERM GOAL #2   Title Pt will perform 20 reps of SLR on L LE with 0/10 pain or compensation to demonstrate proper quad control for ambulation and ADLs   Baseline Cannot perform SLR wihtout quad lag   Time 8   Period Weeks   Status New   PT LONG TERM GOAL #3   Title Pt will score >40 on the LEFS to indicate a higher level of function.   Baseline 28/80   Time 8   Period Weeks   Status New               Plan - 06/23/15 1139    Clinical Impression Statement Pt is a pleasant 40 y/o F who present to therapy s/p L ACL reconstruction after falling on ice in early Jan.  Pt ambulates with a L knee immobilizer that is locked to 60 deg flex and is able to don and doff the device without assistance.  She is able to perform all exercises as prescribed by her doctor, however, has difficulty actively contracting the L quad during exercise.  Pt was instructed to continue to work on quad  activation, especially during SLR exercise, in order to demonstrate proper quad control to d/c use of immobilizer and improve gait.  AROM: flex/ext - 83/+2 hyperext.  LEFS: 28/80.  Pt demonstrates ROM, strength, and gait defictis that will be  improved with skilled PT.     Pt will benefit from skilled therapeutic intervention in order to improve on the following deficits Abnormal gait;Decreased activity tolerance;Decreased balance;Decreased endurance;Decreased mobility;Decreased strength;Decreased skin integrity;Decreased range of motion;Difficulty walking;Hypomobility;Increased edema;Pain   Rehab Potential Good   Clinical Impairments Affecting Rehab Potential Age, first occurrance of injury, myasthenia gravis, good overall health   PT Frequency 2x / week   PT Duration 8 weeks   PT Treatment/Interventions ADLs/Self Care Home Management;Aquatic Therapy;Cryotherapy;Electrical Stimulation;Moist Heat;Balance training;Therapeutic exercise;Stair training;Gait training;Ultrasound;Neuromuscular re-education;Patient/family education;Manual techniques;Dry needling;Passive range of motion;Scar mobilization   PT Next Visit Plan Continue exercises and mobilization per protocol   PT Home Exercise Plan SLR, quad sets, calf stretch, PROM   Consulted and Agree with Plan of Care Patient         Problem List Patient Active Problem List   Diagnosis Date Noted  . 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, [place], [address] Phone: [phone]   Fax:  [phone]  Name: Julie Skinner MRN: [id number] Date of Birth: July 22, 1975

## 2015-06-27 ENCOUNTER — Encounter: Payer: 59 | Admitting: Physical Therapy

## 2015-06-27 ENCOUNTER — Encounter: Payer: Self-pay | Admitting: Physical Therapy

## 2015-06-27 ENCOUNTER — Ambulatory Visit: Payer: 59 | Admitting: Physical Therapy

## 2015-06-27 DIAGNOSIS — S83512A Sprain of anterior cruciate ligament of left knee, initial encounter: Secondary | ICD-10-CM

## 2015-06-27 DIAGNOSIS — M6281 Muscle weakness (generalized): Secondary | ICD-10-CM

## 2015-06-27 NOTE — Therapy (Signed)
[facility] [address], [place], [address] Phone: [phone]   Fax:  [phone]  Physical Therapy Treatment  Patient Details  Name: Julie Skinner MRN: [id number] Date of Birth: [birth year] Referring Provider: Kelli Hope, MD  Encounter Date: 06/27/2015      PT End of Session - 06/27/15 1205    Visit Number 2   Number of Visits 17   Date for PT Re-Evaluation 08/04/15   PT Start Time 1010   PT Stop Time 1048   PT Time Calculation (min) 38 min   Equipment Utilized During Treatment Left knee immobilizer   Activity Tolerance Patient tolerated treatment well   Behavior During Therapy [facility] for tasks assessed/performed      Past Medical History  Diagnosis Date  . Myasthenia gravis ([place]) 2007  . Left breast lump   . Myasthenia gravis ([place])   . Vaginal Pap smear, abnormal     Past Surgical History  Procedure Laterality Date  . Cesarean section  [id number]  . Breast surgery Left 1999    lump removed  . Thymectomy  2007  . Thymetomy  2007    There were no vitals filed for this visit.  Visit Diagnosis:  Left ACL tear, initial encounter  Muscle weakness      Subjective Assessment - 06/27/15 1008    Subjective Pt reports having no problems over the weekend with only minor stiffness in the morning possibly due to her leg being extended all night.     Pertinent History s/p L ACL reconstruction (hamstring autograft) 06/14/2015 following fall which occurred 05/14/2015. Pt maintained her activity level following fall, chose to have ACL repair to decr. likelihood of future injury rather than due to pain. Pt is active, skis for fun and has two young children which she would like to keep up with.   Limitations Sitting;Lifting;Standing;Walking;House hold activities   How long can you sit comfortably? Currently pt is elevating LE when sitting   How long can you stand comfortably? Only standing with brace.    How long can you walk comfortably? Pt described 65' of walking as "a long walk" currently.    Diagnostic tests imaging, pre/post surgery   Patient Stated Goals Jogging, skiing, and performing ADLs normally   Currently in Pain? No/denies   Pain Onset More than a month ago   Multiple Pain Sites No        Objective:  3 min patellar mobilizations to promote mobility in L knee and allow incr ROM.  No tenderness or restrictions other than lat glide being mildly restricted. 2x10 SLR to incr strength and control to R knee and quad.  Pt required cuing to fully contract quad before raising leg off the table.  This exercise is much improved since last visit with minimal quad lag noted at the initial phase of exercise.  Pt reported a minor incr in pain due to muscle activation, but was able to fully participate. 5 min AAROM heel slides to incr knee flex in R knee.  Pt reported the R knee feeling "looser" as exercise progressed. 2x10 SL hip ABD to strengthen R LE and incr functional ability to perform ADLs.  Cuing needed to maintain full knee ext throughout exercise with pt reporting an incr in pain due to muscle activation during the last set of the exercise.  PT instructed pt to include this exercise as part of her HEP.  PT Education - 06/27/15 1204    Education provided Yes   Education Details Pt educated on new HEP including the addition of SL hip abd while maintaining knee ext throughout exercises.   Person(s) Educated Patient   Methods Explanation;Demonstration;Verbal cues;Tactile cues   Comprehension Returned demonstration;Verbalized understanding             PT Long Term Goals - 06/23/15 1156    PT LONG TERM GOAL #1   Title Pt will be able to flex L knee at least the same amount as the R to allow normal gait and ability to perform ADLs   Baseline 83 deg active flex   Time 8   Period Weeks   Status New   PT LONG TERM GOAL #2   Title  Pt will perform 20 reps of SLR on L LE with 0/10 pain or compensation to demonstrate proper quad control for ambulation and ADLs   Baseline Cannot perform SLR wihtout quad lag   Time 8   Period Weeks   Status New   PT LONG TERM GOAL #3   Title Pt will score >40 on the LEFS to indicate a higher level of function.   Baseline 28/80   Time 8   Period Weeks   Status New               Plan - 06/27/15 1211    Clinical Impression Statement Pt presents with decr strength and ROM in R knee due to s/p R ACL reconstruction.  Point tenderness to lat side of knee just sup to patella.  Pt demsonstrated improved quad control with SLR, however, there remains minimal quad lag at the initial phase of the exercise.  AAROM - flex: 102; ext: 0.  Pt was able to tolerate incr exercise progression including AAROM knee flex with only minor pain and muscle soreness.  Next session PT will focus on continuing to strengthen R LE while incorporating gait training to improve ambulation and ADLs.  Pt is compliant with HEP and is eager to continue to progress in her treatment.  She would continue to benefit from skilled PT to address her functional deficits.   Pt will benefit from skilled therapeutic intervention in order to improve on the following deficits Abnormal gait;Decreased activity tolerance;Decreased balance;Decreased endurance;Decreased mobility;Decreased strength;Decreased skin integrity;Decreased range of motion;Difficulty walking;Hypomobility;Increased edema;Pain   Rehab Potential Good   Clinical Impairments Affecting Rehab Potential Age, first occurrance of injury, myasthenia gravis, good overall health   PT Frequency 2x / week   PT Duration 8 weeks   PT Treatment/Interventions ADLs/Self Care Home Management;Aquatic Therapy;Cryotherapy;Electrical Stimulation;Moist Heat;Balance training;Therapeutic exercise;Stair training;Gait training;Neuromuscular re-education;Patient/family education;Manual techniques;Dry  needling;Passive range of motion;Scar mobilization   PT Next Visit Plan Continue exercises and mobilization per protocol   PT Home Exercise Plan SLR, quad sets, calf stretch, PROM   Consulted and Agree with Plan of Care Patient        Problem List Patient Active Problem List   Diagnosis Date Noted  . 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, [place], [address] Phone: [phone]   Fax:  [phone]  Name: TEANNA CARLI MRN: [id number] Date of Birth: 1976-04-22   This entire session was performed under direct supervision and direction of a licensed therapist/therapist assistant . I have personally read, edited and approve of the note as written.  Kerman Passey, PT,  DPT

## 2015-06-30 ENCOUNTER — Encounter: Payer: Self-pay | Admitting: Physical Therapy

## 2015-06-30 ENCOUNTER — Ambulatory Visit: Payer: 59 | Admitting: Physical Therapy

## 2015-06-30 DIAGNOSIS — S83512A Sprain of anterior cruciate ligament of left knee, initial encounter: Secondary | ICD-10-CM

## 2015-06-30 DIAGNOSIS — M6281 Muscle weakness (generalized): Secondary | ICD-10-CM

## 2015-06-30 NOTE — Therapy (Signed)
[facility] [address], [place], [address] Phone: [phone]   Fax:  [phone]  Physical Therapy Treatment  Patient Details  Name: Julie Skinner MRN: [id number] Date of Birth: 1976-01-08 Referring Provider: Kelli Hope, MD  Encounter Date: 06/30/2015      PT End of Session - 06/30/15 1023    Visit Number 3   Number of Visits 17   Date for PT Re-Evaluation 08/04/15   PT Start Time 0930   PT Stop Time 1023   PT Time Calculation (min) 53 min   Activity Tolerance Patient tolerated treatment well   Behavior During Therapy [facility] for tasks assessed/performed      Past Medical History  Diagnosis Date  . Myasthenia gravis ([place]) 2007  . Left breast lump   . Myasthenia gravis ([place])   . Vaginal Pap smear, abnormal     Past Surgical History  Procedure Laterality Date  . Cesarean section  [id number]  . Breast surgery Left 1999    lump removed  . Thymectomy  2007  . Thymetomy  2007    There were no vitals filed for this visit.  Visit Diagnosis:  Left ACL tear, initial encounter  Muscle weakness      Subjective Assessment - 06/30/15 0937    Subjective Pt reports no issues with her knee and is able to fully participate in her HEP.  She states that she is unable to perform step over step ascending stairs and that she feels "too weak right now".   Pertinent History s/p L ACL reconstruction (hamstring autograft) 06/14/2015 following fall which occurred 05/14/2015. Pt maintained her activity level following fall, chose to have ACL repair to decr. likelihood of future injury rather than due to pain. Pt is active, skis for fun and has two young children which she would like to keep up with.   Limitations Sitting;Lifting;Standing;Walking;House hold activities   How long can you sit comfortably? Currently pt is elevating LE when sitting   How long can you stand comfortably? Only standing with brace.   How  long can you walk comfortably? Pt described 66' of walking as "a long walk" currently.    Diagnostic tests imaging, pre/post surgery   Patient Stated Goals Jogging, skiing, and performing ADLs normally   Currently in Pain? No/denies   Pain Onset More than a month ago   Multiple Pain Sites No        Objective:  3x10 SLR to incr strength in L LE with the goal of demonstrating good quad control.  Pt was able to perform all three sets without any quad lag indicating that she is able to ambulate without her brace.   3x10 4 way hip in lying to incr strength in L LE in all directions.  Pt was able to complete all three sets for each direction except hip ADD due to fatigue.  Cuing needed to maintain a straight knee throughout the exercise and to keep hips on the table during hip ext.  Pt was instructed to perform as part of her HEP. 5 min AAROM heel slides to incr L knee flex and ability to ambulate normally.  Pt experienced a slight incr in pain and was cued to avoid holding the knee in flex for longer than two seconds in order to prevent pain during exercise.  After exercise AAROM was measured at 113 def flex and 0 deg ext. 3x1 min B heel/toe weight shifts with feet shoulder  width apart, lat weight shifts with foot lift off, and in walk stance with L LE forward to promote incr proprioception in joint and allow LE muscle to incr in functional strength.  Initially, pt experienced the most difficulty with straightening the L knee when weight was shifted on that side including slight knee buckling, however, pt improved as she progressed with the exercise.  Pt was instructed to perform these exercises as part of her HEP. 3x10 calf raises to incr strength in B gastroc and ability to perform ADLs at home.  Pt stated that she felt a "burn" in the calves towards the end of each set. At the end of the session pt was educated to d/c use of brace at home and in areas where she feels safe without it while continuing  to wear the brace in public as well as areas where she feels vulnerable/unsafe.                         PT Education - 06/30/15 0941    Education provided Yes   Education Details Pt was educated on new HEP and although she has been cleared to not wear the brace at home, she was instructed to wear the brace in public if she feels unsafe without it.   Person(s) Educated Patient   Methods Explanation;Demonstration;Verbal cues   Comprehension Returned demonstration;Verbalized understanding             PT Long Term Goals - 06/23/15 1156    PT LONG TERM GOAL #1   Title Pt will be able to flex L knee at least the same amount as the R to allow normal gait and ability to perform ADLs   Baseline 83 deg active flex   Time 8   Period Weeks   Status New   PT LONG TERM GOAL #2   Title Pt will perform 20 reps of SLR on L LE with 0/10 pain or compensation to demonstrate proper quad control for ambulation and ADLs   Baseline Cannot perform SLR wihtout quad lag   Time 8   Period Weeks   Status New   PT LONG TERM GOAL #3   Title Pt will score >40 on the LEFS to indicate a higher level of function.   Baseline 28/80   Time 8   Period Weeks   Status New               Plan - 06/30/15 1024    Clinical Impression Statement Pt demonstrates improved ROM and strength in the L knee and quad as evidenced by her ability to perform 3x10 SLR without quad lag.  AAROM: flex/ext: 113/0.  No tenderness to palpation in the L knee or surrounding musculature.  Pt was able to tolerate an incr in exercise difficulty and progression indicating strength and control are progressing ahead of schedule.  Due to ability to perform SLR without quad lag, PT d/c the use of her brace while at home and in areas where she feel safe without it per protocol.  However, PT instructed pt to wear the brace in public areas or when she feels unsafe/vulnerable without it.  Pt still possesses functional and strength  deficits that prevent her from normal gait although this will continue to improve with exercise and HEP.  She would continue to benefit from skilled PT to address these issues.   Pt will benefit from skilled therapeutic intervention in order to improve on the following  deficits Abnormal gait;Decreased activity tolerance;Decreased balance;Decreased endurance;Decreased mobility;Decreased strength;Decreased skin integrity;Decreased range of motion;Difficulty walking;Hypomobility;Increased edema;Pain   Rehab Potential Good   Clinical Impairments Affecting Rehab Potential Age, first occurrance of injury, myasthenia gravis, good overall health   PT Frequency 2x / week   PT Duration 8 weeks   PT Treatment/Interventions ADLs/Self Care Home Management;Aquatic Therapy;Cryotherapy;Electrical Stimulation;Moist Heat;Balance training;Therapeutic exercise;Stair training;Gait training;Neuromuscular re-education;Patient/family education;Manual techniques;Dry needling;Passive range of motion;Scar mobilization   PT Next Visit Plan Continue exercises and mobilization per protocol   PT Home Exercise Plan SLR, quad sets, calf stretch, PROM   Consulted and Agree with Plan of Care Patient        Problem List Patient Active Problem List   Diagnosis Date Noted  . 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 [place], [address] Phone: [phone]   Fax:  [phone]  Name: Julie Skinner MRN: [id number] Date of Birth: October 07, 1975

## 2015-07-01 ENCOUNTER — Ambulatory Visit (INDEPENDENT_AMBULATORY_CARE_PROVIDER_SITE_OTHER): Payer: 59 | Admitting: Obstetrics and Gynecology

## 2015-07-01 VITALS — BP 130/80 | HR 68 | Wt 150.4 lb

## 2015-07-01 DIAGNOSIS — E663 Overweight: Secondary | ICD-10-CM | POA: Diagnosis not present

## 2015-07-01 MED ORDER — CYANOCOBALAMIN 1000 MCG/ML IJ SOLN
1000.0000 ug | Freq: Once | INTRAMUSCULAR | Status: AC
  Administered 2015-07-01: 1000 ug via INTRAMUSCULAR

## 2015-07-01 NOTE — Progress Notes (Cosign Needed)
Patient ID: Julie Skinner, female   DOB: [birth year], 39 y.o.   MRN: [id number] Pt presents for weight, B/P, B-12 injection. No side effects of medication-Phentermine, or B-12.  Weight loss of  2 lbs. Encouraged eating healthy and exercise.

## 2015-07-04 ENCOUNTER — Ambulatory Visit: Payer: 59 | Admitting: Physical Therapy

## 2015-07-04 DIAGNOSIS — M6281 Muscle weakness (generalized): Secondary | ICD-10-CM

## 2015-07-04 DIAGNOSIS — S83512A Sprain of anterior cruciate ligament of left knee, initial encounter: Secondary | ICD-10-CM | POA: Diagnosis not present

## 2015-07-04 NOTE — Therapy (Signed)
[facility] [address] [place], [address] Phone: [phone]   Fax:  [phone]  Physical Therapy Treatment  Patient Details  Name: Julie Skinner MRN: [id number] Date of Birth: Jul 23, 1975 Referring Provider: Kelli Hope, MD  Encounter Date: 07/04/2015      PT End of Session - 07/04/15 [id number]    Visit Number 4   Number of Visits 17   Date for PT Re-Evaluation 08/04/15   PT Start Time [id number]   PT Stop Time 1518   PT Time Calculation (min) 41 min   Activity Tolerance Patient tolerated treatment well   Behavior During Therapy [facility] for tasks assessed/performed      Past Medical History  Diagnosis Date  . Myasthenia gravis ([place]) 2007  . Left breast lump   . Myasthenia gravis ([place])   . Vaginal Pap smear, abnormal     Past Surgical History  Procedure Laterality Date  . Cesarean section  [id number]  . Breast surgery Left 1999    lump removed  . Thymectomy  2007  . Thymetomy  2007    There were no vitals filed for this visit.  Visit Diagnosis:  Muscle weakness      Subjective Assessment - 07/04/15 1435    Subjective Pt has had occasional feelings of tightness, no difficulty.   Pertinent History s/p L ACL reconstruction (hamstring autograft) 06/14/2015 following fall which occurred 05/14/2015. Pt maintained her activity level following fall, chose to have ACL repair to decr. likelihood of future injury rather than due to pain. Pt is active, skis for fun and has two young children which she would like to keep up with.   Limitations Sitting;Lifting;Standing;Walking;House hold activities   How long can you sit comfortably? Currently pt is elevating LE when sitting   How long can you stand comfortably? Only standing with brace.   How long can you walk comfortably? Pt described 68' of walking as "a long walk" currently.    Diagnostic tests imaging, pre/post surgery   Patient Stated Goals Jogging,  skiing, and performing ADLs normally   Currently in Pain? No/denies   Pain Onset More than a month ago                  Objective: x3 min knee extension stretching which improved extension from -8 to 0 degrees.  Knee flexion stretching in supine with 90 deg. Hip flexion. 3 min total with cuing for relaxation.  Patellar mobs - grade II 3x45 sec. Med/lat.  Standing wt shifts side to side 3x2 min with cuing for even wt shifting.  Partial squats 3x2 min. Pt reported pain with this initially. Performed 3x20 toe taps on 2nd step before continuing with squats which decr. Pain considerably.                      PT Long Term Goals - 06/23/15 1156    PT LONG TERM GOAL #1   Title Pt will be able to flex L knee at least the same amount as the R to allow normal gait and ability to perform ADLs   Baseline 83 deg active flex   Time 8   Period Weeks   Status New   PT LONG TERM GOAL #2   Title Pt will perform 20 reps of SLR on L LE with 0/10 pain or compensation to demonstrate proper quad control for ambulation and ADLs   Baseline Cannot perform SLR wihtout quad  lag   Time 8   Period Weeks   Status New   PT LONG TERM GOAL #3   Title Pt will score >40 on the LEFS to indicate a higher level of function.   Baseline 28/80   Time 8   Period Weeks   Status New               Plan - 07/04/15 1552    Clinical Impression Statement Pt has made considerable improvement. She is currently Biomedical scientist. knee flexion with gait, circumducting to clear foot due to feeling stiffness in front of knee. ROM able to tolerate full extension and flexion to 95. Beyond that pt has difficulty so will continue to work to 100 deg. pt is unable to squat greater than 15 deg. currently. Would benefit form continued PT to address these issues.   Pt will benefit from skilled therapeutic intervention in order to improve on the following deficits Abnormal gait;Decreased activity  tolerance;Decreased balance;Decreased endurance;Decreased mobility;Decreased strength;Decreased skin integrity;Decreased range of motion;Difficulty walking;Hypomobility;Increased edema;Pain   Rehab Potential Good   Clinical Impairments Affecting Rehab Potential Age, first occurrance of injury, myasthenia gravis, good overall health   PT Frequency 2x / week   PT Duration 8 weeks   PT Treatment/Interventions ADLs/Self Care Home Management;Aquatic Therapy;Cryotherapy;Electrical Stimulation;Moist Heat;Balance training;Therapeutic exercise;Stair training;Gait training;Neuromuscular re-education;Patient/family education;Manual techniques;Dry needling;Passive range of motion;Scar mobilization   PT Next Visit Plan Continue exercises and mobilization per protocol   PT Home Exercise Plan SLR, quad sets, calf stretch, PROM   Consulted and Agree with Plan of Care Patient        Problem List Patient Active Problem List   Diagnosis Date Noted  . 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, [place], [address] Phone: [phone]   Fax:  [phone]  Name: Julie Skinner MRN: [id number] Date of Birth: 01-28-1976

## 2015-07-05 ENCOUNTER — Ambulatory Visit: Payer: 59 | Admitting: Obstetrics and Gynecology

## 2015-07-07 ENCOUNTER — Ambulatory Visit: Payer: 59 | Attending: Orthopedic Surgery | Admitting: Physical Therapy

## 2015-07-07 DIAGNOSIS — M6281 Muscle weakness (generalized): Secondary | ICD-10-CM

## 2015-07-07 DIAGNOSIS — S83512A Sprain of anterior cruciate ligament of left knee, initial encounter: Secondary | ICD-10-CM | POA: Insufficient documentation

## 2015-07-07 DIAGNOSIS — X58XXXA Exposure to other specified factors, initial encounter: Secondary | ICD-10-CM | POA: Diagnosis not present

## 2015-07-07 DIAGNOSIS — M25661 Stiffness of right knee, not elsewhere classified: Secondary | ICD-10-CM | POA: Insufficient documentation

## 2015-07-07 NOTE — Therapy (Signed)
[facility] [address], [place], [address] Phone: [phone]   Fax:  [phone]  Physical Therapy Treatment  Patient Details  Name: Julie Skinner MRN: [id number] Date of Birth: 28-Nov-1975 Referring Provider: Kelli Hope, MD  Encounter Date: 07/07/2015      PT End of Session - 07/07/15 1058    Visit Number 5   Number of Visits 17   Date for PT Re-Evaluation 08/04/15   PT Start Time [id number]   PT Stop Time 1115   PT Time Calculation (min) 40 min   Activity Tolerance Patient tolerated treatment well   Behavior During Therapy [facility] for tasks assessed/performed      Past Medical History  Diagnosis Date  . Myasthenia gravis ([place]) 2007  . Left breast lump   . Myasthenia gravis ([place])   . Vaginal Pap smear, abnormal     Past Surgical History  Procedure Laterality Date  . Cesarean section  [id number]  . Breast surgery Left 1999    lump removed  . Thymectomy  2007  . Thymetomy  2007    There were no vitals filed for this visit.  Visit Diagnosis:  Muscle weakness  Left ACL tear, initial encounter      Subjective Assessment - 07/07/15 1040    Subjective Pt reports no pain. She has been walking more and has also noticed she is able to perform stairs better including step over step.   Pertinent History s/p L ACL reconstruction (hamstring autograft) 06/14/2015 following fall which occurred 05/14/2015. Pt maintained her activity level following fall, chose to have ACL repair to decr. likelihood of future injury rather than due to pain. Pt is active, skis for fun and has two young children which she would like to keep up with.   Limitations Sitting;Lifting;Standing;Walking;House hold activities   How long can you sit comfortably? Currently pt is elevating LE when sitting   How long can you stand comfortably? Only standing with brace.   How long can you walk comfortably? Pt described 34' of walking as "a  long walk" currently.    Diagnostic tests imaging, pre/post surgery   Patient Stated Goals Jogging, skiing, and performing ADLs normally   Currently in Pain? No/denies   Pain Onset More than a month ago              Objective: Scar massage including instruction and performance - approximation massage avoiding superior area of eschar - initially poorly mobile, with performance able to improve mobility in scar and reduce "pull" feeling at end range of knee flexion.  amb on balance beam, 10x10' with no cuing other than to "pay attention to symmetry". Demonstrated circumduction.  10x10' with cuing to flex knees - pt able to do well for first 6 sets, then had difficulty.  10x10' with cuing for heel to toe and slower gait - pt able to perform with periodic reminders.  Step ups 3x15 on 4" step with cuing to avoid hip hike/improve knee flex.  Step downs 3x8 on 4" step  Step overs 2x15 on 4" step pt had easier time with control with step overs rather than step downs.                   PT Education - 07/07/15 1057    Education provided Yes   Education Details educated on progression to step ups, gait   Person(s) Educated Patient   Methods Explanation   Comprehension Verbalized understanding  PT Long Term Goals - 06/23/15 1156    PT LONG TERM GOAL #1   Title Pt will be able to flex L knee at least the same amount as the R to allow normal gait and ability to perform ADLs   Baseline 83 deg active flex   Time 8   Period Weeks   Status New   PT LONG TERM GOAL #2   Title Pt will perform 20 reps of SLR on L LE with 0/10 pain or compensation to demonstrate proper quad control for ambulation and ADLs   Baseline Cannot perform SLR wihtout quad lag   Time 8   Period Weeks   Status New   PT LONG TERM GOAL #3   Title Pt will score >40 on the LEFS to indicate a higher level of function.   Baseline 28/80   Time 8   Period Weeks   Status New                Plan - 07/07/15 1058    Clinical Impression Statement Pt continues to make significant progress towards protocol. ROM is full per allowable ROM (0-100). Educated pt on gentle scar massage, pt continues to have decr. knee flex with gait, circumducts instead. functional strength is improving as seen by ability to perform 4" step ups.  Pt would benefit from continued PT  to progress protocol, improve gait, strength and ROM   Pt will benefit from skilled therapeutic intervention in order to improve on the following deficits Abnormal gait;Decreased activity tolerance;Decreased balance;Decreased endurance;Decreased mobility;Decreased strength;Decreased skin integrity;Decreased range of motion;Difficulty walking;Hypomobility;Increased edema;Pain   Rehab Potential Good   Clinical Impairments Affecting Rehab Potential Age, first occurrance of injury, myasthenia gravis, good overall health   PT Frequency 2x / week   PT Duration 8 weeks   PT Treatment/Interventions ADLs/Self Care Home Management;Aquatic Therapy;Cryotherapy;Electrical Stimulation;Moist Heat;Balance training;Therapeutic exercise;Stair training;Gait training;Neuromuscular re-education;Patient/family education;Manual techniques;Dry needling;Passive range of motion;Scar mobilization   PT Next Visit Plan Continue exercises and mobilization per protocol   PT Home Exercise Plan SLR, quad sets, calf stretch, PROM   Consulted and Agree with Plan of Care Patient        Problem List Patient Active Problem List   Diagnosis Date Noted  . 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, [place], [address] Phone: [phone]   Fax:  [phone]  Name: Julie Skinner MRN: [id number] Date of Birth: 1975-11-07

## 2015-07-11 ENCOUNTER — Ambulatory Visit: Payer: 59 | Admitting: Physical Therapy

## 2015-07-14 ENCOUNTER — Ambulatory Visit: Payer: 59 | Admitting: Physical Therapy

## 2015-07-14 DIAGNOSIS — S83512A Sprain of anterior cruciate ligament of left knee, initial encounter: Secondary | ICD-10-CM

## 2015-07-14 DIAGNOSIS — M6281 Muscle weakness (generalized): Secondary | ICD-10-CM | POA: Diagnosis not present

## 2015-07-14 NOTE — Therapy (Signed)
[facility] [address] [place], [address] Phone: [phone]   Fax:  [phone]  Physical Therapy Treatment  Patient Details  Name: Julie Skinner MRN: [id number] Date of Birth: Nov 19, 1975 Referring Provider: Kelli Hope, MD  Encounter Date: 07/14/2015      PT End of Session - 07/14/15 1312    Visit Number 6   Number of Visits 17   Date for PT Re-Evaluation 08/04/15   PT Start Time [id number]   PT Stop Time 1345   PT Time Calculation (min) 40 min   Activity Tolerance Patient tolerated treatment well   Behavior During Therapy [facility] for tasks assessed/performed      Past Medical History  Diagnosis Date  . Myasthenia gravis ([place]) 2007  . Left breast lump   . Myasthenia gravis ([place])   . Vaginal Pap smear, abnormal     Past Surgical History  Procedure Laterality Date  . Cesarean section  [id number]  . Breast surgery Left 1999    lump removed  . Thymectomy  2007  . Thymetomy  2007    There were no vitals filed for this visit.  Visit Diagnosis:  Left ACL tear, initial encounter  Muscle weakness      Subjective Assessment - 07/14/15 1310    Subjective Pt reports she is having some difficulty with sleeping due to stiffness.   Pertinent History s/p L ACL reconstruction (hamstring autograft) 06/14/2015 following fall which occurred 05/14/2015. Pt maintained her activity level following fall, chose to have ACL repair to decr. likelihood of future injury rather than due to pain. Pt is active, skis for fun and has two young children which she would like to keep up with.   Limitations Sitting;Lifting;Standing;Walking;House hold activities   How long can you sit comfortably? Currently pt is elevating LE when sitting   How long can you stand comfortably? Only standing with brace.   How long can you walk comfortably? Pt described 31' of walking as "a long walk" currently.    Diagnostic tests imaging,  pre/post surgery   Patient Stated Goals Jogging, skiing, and performing ADLs normally   Currently in Pain? No/denies   Pain Onset More than a month ago             Objective: Single leg balance performed to assess strength and control in involved LE. 3x30 sec. Each side with knee bent. Supine bridge - initially painful with "pulling" sensation in L knee. With practice improved. Supine bridge with single leg lifts, 3x5. Same with marching 3x10 each.  amb in clinic. Noted incr. Trembling following there ex.  Performed waiter's bow 3x20 times with extensive cuing for avoiding lumbar flexion.  airex single leg taps 3x10.                    PT Education - 07/14/15 1310    Education provided Yes   Education Details strategy for sleeping.   Person(s) Educated Patient   Methods Explanation   Comprehension Verbalized understanding             PT Long Term Goals - 06/23/15 1156    PT LONG TERM GOAL #1   Title Pt will be able to flex L knee at least the same amount as the R to allow normal gait and ability to perform ADLs   Baseline 83 deg active flex   Time 8   Period Weeks   Status New   PT  LONG TERM GOAL #2   Title Pt will perform 20 reps of SLR on L LE with 0/10 pain or compensation to demonstrate proper quad control for ambulation and ADLs   Baseline Cannot perform SLR wihtout quad lag   Time 8   Period Weeks   Status New   PT LONG TERM GOAL #3   Title Pt will score >40 on the LEFS to indicate a higher level of function.   Baseline 28/80   Time 8   Period Weeks   Status New               Plan - 07/14/15 1322    Clinical Impression Statement Pt has most difficulty with single leg bridge activity. Not yet ready for phase III as pt has difficulty with B bridge and unilateral balance test. Look to progress these at next session in order to continue into phase III of her rehab. Continues to have decr. ROM, weakness, impaired balance, and  impaired gait.   Pt will benefit from skilled therapeutic intervention in order to improve on the following deficits Abnormal gait;Decreased activity tolerance;Decreased balance;Decreased endurance;Decreased mobility;Decreased strength;Decreased skin integrity;Decreased range of motion;Difficulty walking;Hypomobility;Increased edema;Pain   Rehab Potential Good   Clinical Impairments Affecting Rehab Potential Age, first occurrance of injury, myasthenia gravis, good overall health   PT Frequency 2x / week   PT Duration 8 weeks   PT Treatment/Interventions ADLs/Self Care Home Management;Aquatic Therapy;Cryotherapy;Electrical Stimulation;Moist Heat;Balance training;Therapeutic exercise;Stair training;Gait training;Neuromuscular re-education;Patient/family education;Manual techniques;Dry needling;Passive range of motion;Scar mobilization   PT Next Visit Plan Continue exercises and mobilization per protocol   PT Home Exercise Plan SLR, quad sets, calf stretch, PROM   Consulted and Agree with Plan of Care Patient        Problem List Patient Active Problem List   Diagnosis Date Noted  . 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 [place], [address] Phone: [phone]   Fax:  [phone]  Name: Julie Skinner MRN: [id number] Date of Birth: Apr 01, 1976

## 2015-07-18 ENCOUNTER — Encounter: Payer: 59 | Admitting: Physical Therapy

## 2015-07-19 ENCOUNTER — Ambulatory Visit: Payer: 59 | Admitting: Physical Therapy

## 2015-07-19 DIAGNOSIS — M6281 Muscle weakness (generalized): Secondary | ICD-10-CM | POA: Diagnosis not present

## 2015-07-19 DIAGNOSIS — M25661 Stiffness of right knee, not elsewhere classified: Secondary | ICD-10-CM

## 2015-07-19 NOTE — Therapy (Signed)
[facility] [address] [place], [address] Phone: [phone]   Fax:  [phone]  Physical Therapy Treatment  Patient Details  Name: Julie Skinner MRN: [id number] Date of Birth: 02-24-1976 Referring Provider: Kelli Hope, MD  Encounter Date: 07/19/2015      PT End of Session - 07/19/15 1359    Visit Number 7   Number of Visits 17   Date for PT Re-Evaluation 08/04/15   PT Start Time 2035   PT Stop Time 1424   PT Time Calculation (min) 39 min   Activity Tolerance Patient tolerated treatment well   Behavior During Therapy [facility] for tasks assessed/performed      Past Medical History  Diagnosis Date  . Myasthenia gravis ([place]) 2007  . Left breast lump   . Myasthenia gravis ([place])   . Vaginal Pap smear, abnormal     Past Surgical History  Procedure Laterality Date  . Cesarean section  [id number]  . Breast surgery Left 1999    lump removed  . Thymectomy  2007  . Thymetomy  2007    There were no vitals filed for this visit.  Visit Diagnosis:  Muscle weakness  Stiffness of right knee      Subjective Assessment - 07/19/15 1348    Subjective Pt reports she has returned to work. has no difficulty with this.   Pertinent History s/p L ACL reconstruction (hamstring autograft) 06/14/2015 following fall which occurred 05/14/2015. Pt maintained her activity level following fall, chose to have ACL repair to decr. likelihood of future injury rather than due to pain. Pt is active, skis for fun and has two young children which she would like to keep up with.   Limitations Sitting;Lifting;Standing;Walking;House hold activities   How long can you sit comfortably? Currently pt is elevating LE when sitting   How long can you stand comfortably? Only standing with brace.   How long can you walk comfortably? Pt described 31' of walking as "a long walk" currently.    Diagnostic tests imaging, pre/post surgery   Patient Stated Goals Jogging, skiing, and performing ADLs normally   Currently in Pain? No/denies   Pain Score 0-No pain   Pain Onset More than a month ago            Objective: PROM extension stretching, 10 min total, 5 min at start and 5 min at end of session, performed in supine with heel on pillow.  YTB TKEs 3x15 with cuing for fighting for additional ROM.  OMEGA leg press pyramid, 35#, 55#, 75# for 10 reps, 6 reps, 4 reps and return. ALso performed unilateral leg press 2x7 35#.  amb in clinic, decr. Requirement for cuing to avoid circumduction and able to perform well, some decrement as gait continued throughout session possibly due to pain.  SLS 2x30 sec, 2x30 sec. EC.  1x10 bridge, encouraged pt to begin to perform bridging at home.  4 min practice of wall sit, at 70 deg. Able to perform 1 min wall sit with minimal pain.  Following session pt reported mild pain/stiffness in knee, also reported some fear with leg press initially.                     PT Education - 07/19/15 1359    Education provided Yes   Education Details progression to phase II of rehab protocol   Person(s) Educated Patient   Methods Explanation   Comprehension Verbalized understanding  PT Long Term Goals - 06/23/15 1156    PT LONG TERM GOAL #1   Title Pt will be able to flex L knee at least the same amount as the R to allow normal gait and ability to perform ADLs   Baseline 83 deg active flex   Time 8   Period Weeks   Status New   PT LONG TERM GOAL #2   Title Pt will perform 20 reps of SLR on L LE with 0/10 pain or compensation to demonstrate proper quad control for ambulation and ADLs   Baseline Cannot perform SLR wihtout quad lag   Time 8   Period Weeks   Status New   PT LONG TERM GOAL #3   Title Pt will score >40 on the LEFS to indicate a higher level of function.   Baseline 28/80   Time 8   Period Weeks   Status New               Plan -  07/19/15 1424    Clinical Impression Statement Pt has met all criteria for progression to phase 3 of protocol. Is making appropriate or even advanced progress in terms of strength, however lacks active end range extension and is limited in knee flexion to 130 deg. Would benefit from continued PT to progress strengthening, single limb control, pelvic control, and ROM.   Pt will benefit from skilled therapeutic intervention in order to improve on the following deficits Abnormal gait;Decreased activity tolerance;Decreased balance;Decreased endurance;Decreased mobility;Decreased strength;Decreased skin integrity;Decreased range of motion;Difficulty walking;Hypomobility;Increased edema;Pain   Rehab Potential Good   Clinical Impairments Affecting Rehab Potential Age, first occurrance of injury, myasthenia gravis, good overall health   PT Frequency 2x / week   PT Duration 8 weeks   PT Treatment/Interventions ADLs/Self Care Home Management;Aquatic Therapy;Cryotherapy;Electrical Stimulation;Moist Heat;Balance training;Therapeutic exercise;Stair training;Gait training;Neuromuscular re-education;Patient/family education;Manual techniques;Dry needling;Passive range of motion;Scar mobilization   PT Next Visit Plan Continue exercises and mobilization per protocol   PT Home Exercise Plan SLR, quad sets, calf stretch, PROM   Consulted and Agree with Plan of Care Patient        Problem List Patient Active Problem List   Diagnosis Date Noted  . 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, [place], [address] Phone: [phone]   Fax:  [phone]  Name: Julie Skinner MRN: [id number] Date of Birth: 11/25/1975

## 2015-07-21 ENCOUNTER — Encounter: Payer: 59 | Admitting: Physical Therapy

## 2015-07-25 ENCOUNTER — Encounter: Payer: 59 | Admitting: Physical Therapy

## 2015-07-25 ENCOUNTER — Ambulatory Visit: Payer: 59 | Admitting: Physical Therapy

## 2015-07-28 ENCOUNTER — Ambulatory Visit: Payer: 59 | Admitting: Physical Therapy

## 2015-07-28 DIAGNOSIS — M25661 Stiffness of right knee, not elsewhere classified: Secondary | ICD-10-CM

## 2015-07-28 DIAGNOSIS — M6281 Muscle weakness (generalized): Secondary | ICD-10-CM

## 2015-07-28 NOTE — Therapy (Signed)
[facility] [address], [place], [address] Phone: [phone]   Fax:  [phone]  Physical Therapy Treatment  Patient Details  Name: Julie Skinner MRN: [id number] Date of Birth: [birth year] Referring Provider: Kelli Hope, MD  Encounter Date: 07/28/2015      PT End of Session - 07/28/15 0854    Visit Number 8   Number of Visits 17   Date for PT Re-Evaluation 08/04/15   PT Start Time 0817   PT Stop Time 0849   PT Time Calculation (min) 32 min   Activity Tolerance Patient tolerated treatment well   Behavior During Therapy [facility] for tasks assessed/performed      Past Medical History  Diagnosis Date  . Myasthenia gravis ([place]) 2007  . Left breast lump   . Myasthenia gravis ([place])   . Vaginal Pap smear, abnormal     Past Surgical History  Procedure Laterality Date  . Cesarean section  [id number]  . Breast surgery Left 1999    lump removed  . Thymectomy  2007  . Thymetomy  2007    There were no vitals filed for this visit.  Visit Diagnosis:  Muscle weakness  Stiffness of right knee      Subjective Assessment - 07/28/15 0818    Subjective Pt reports she is feeling better, is having minimal to no difficulty but has multiple questions regarding progression.   Pertinent History s/p L ACL reconstruction (hamstring autograft) 06/14/2015 following fall which occurred 05/14/2015. Pt maintained her activity level following fall, chose to have ACL repair to decr. likelihood of future injury rather than due to pain. Pt is active, skis for fun and has two young children which she would like to keep up with.   Limitations Sitting;Lifting;Standing;Walking;House hold activities   How long can you sit comfortably? Currently pt is elevating LE when sitting   How long can you stand comfortably? Only standing with brace.   How long can you walk comfortably? Pt described 81' of walking as "a long walk"  currently.    Diagnostic tests imaging, pre/post surgery   Patient Stated Goals Jogging, skiing, and performing ADLs normally   Currently in Pain? No/denies   Pain Score 0-No pain   Pain Onset More than a month ago             Objective: Extensive correction/progression of exercises per protocol:  Calf raise 3x15.  Lunge forward, backward, sideways 3x20, performed B. Cuing needed to minimize back extension with lunge.  Knee extension with no weight 3x10.  amb in clinic x5 min practice focusing on knee flexion.                    PT Education - 07/28/15 0854    Education provided Yes   Education Details dropping to 1xper 3 weeks. deadlift   Person(s) Educated Patient   Methods Explanation   Comprehension Verbalized understanding             PT Long Term Goals - 06/23/15 1156    PT LONG TERM GOAL #1   Title Pt will be able to flex L knee at least the same amount as the R to allow normal gait and ability to perform ADLs   Baseline 83 deg active flex   Time 8   Period Weeks   Status New   PT LONG TERM GOAL #2   Title Pt will perform 20 reps of SLR on L  LE with 0/10 pain or compensation to demonstrate proper quad control for ambulation and ADLs   Baseline Cannot perform SLR wihtout quad lag   Time 8   Period Weeks   Status New   PT LONG TERM GOAL #3   Title Pt will score >40 on the LEFS to indicate a higher level of function.   Baseline 28/80   Time 8   Period Weeks   Status New               Plan - 07/28/15 [id number]    Clinical Impression Statement Pt continues to make progress with strength, gait, and ROM. ROM is now WNL. gait is nearly normal with some slight modification to minimize knee flexion following terminal knee extension. Pt requested to end session early to return to work.    Pt will benefit from skilled therapeutic intervention in order to improve on the following deficits Abnormal gait;Decreased activity tolerance;Decreased  balance;Decreased endurance;Decreased mobility;Decreased strength;Decreased skin integrity;Decreased range of motion;Difficulty walking;Hypomobility;Increased edema;Pain   Rehab Potential Good   Clinical Impairments Affecting Rehab Potential Age, first occurrance of injury, myasthenia gravis, good overall health   PT Frequency 2x / week   PT Duration 8 weeks   PT Treatment/Interventions ADLs/Self Care Home Management;Aquatic Therapy;Cryotherapy;Electrical Stimulation;Moist Heat;Balance training;Therapeutic exercise;Stair training;Gait training;Neuromuscular re-education;Patient/family education;Manual techniques;Dry needling;Passive range of motion;Scar mobilization   PT Next Visit Plan Continue exercises and mobilization per protocol   PT Home Exercise Plan SLR, quad sets, calf stretch, PROM   Consulted and Agree with Plan of Care Patient        Problem List Patient Active Problem List   Diagnosis Date Noted  . 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 [place], [address] Phone: [phone]   Fax:  [phone]  Name: Julie Skinner MRN: [id number] Date of Birth: Oct 08, 1975

## 2015-07-29 ENCOUNTER — Ambulatory Visit (INDEPENDENT_AMBULATORY_CARE_PROVIDER_SITE_OTHER): Payer: 59 | Admitting: Obstetrics and Gynecology

## 2015-07-29 VITALS — BP 135/85 | HR 71 | Wt 153.5 lb

## 2015-07-29 DIAGNOSIS — E663 Overweight: Secondary | ICD-10-CM

## 2015-07-29 MED ORDER — CYANOCOBALAMIN 1000 MCG/ML IJ SOLN
1000.0000 ug | Freq: Once | INTRAMUSCULAR | Status: AC
  Administered 2015-07-29: 1000 ug via INTRAMUSCULAR

## 2015-07-29 NOTE — Progress Notes (Signed)
Patient ID: Julie Skinner, female   DOB: [birth year], 40 y.o.   MRN: [id number] Pt presents for weight, B/P, B-12 injection. No side effects of medication-Phentermine, or B-12.  Weight gain of 3 lbs. Encouraged eating healthy and exercise.

## 2015-08-01 ENCOUNTER — Encounter: Payer: 59 | Admitting: Physical Therapy

## 2015-08-04 ENCOUNTER — Encounter: Payer: 59 | Admitting: Physical Therapy

## 2015-08-08 ENCOUNTER — Encounter: Payer: 59 | Admitting: Physical Therapy

## 2015-08-15 ENCOUNTER — Encounter: Payer: 59 | Admitting: Physical Therapy

## 2015-08-18 ENCOUNTER — Encounter: Payer: 59 | Admitting: Physical Therapy

## 2015-08-22 ENCOUNTER — Ambulatory Visit: Payer: BLUE CROSS/BLUE SHIELD | Attending: Orthopedic Surgery | Admitting: Physical Therapy

## 2015-08-22 DIAGNOSIS — M6281 Muscle weakness (generalized): Secondary | ICD-10-CM | POA: Diagnosis present

## 2015-08-22 NOTE — Therapy (Signed)
[facility] [address] [place], [address] Phone: [phone]   Fax:  [phone]  Physical Therapy Treatment  Patient Details  Name: Julie Skinner MRN: [id number] Date of Birth: 24-Nov-1975 Referring Provider: Kelli Hope, MD  Encounter Date: 08/22/2015      PT End of Session - 08/22/15 1220    Visit Number 9   Number of Visits 17   Date for PT Re-Evaluation 08/22/15   PT Start Time [id number]   PT Stop Time 1210   PT Time Calculation (min) 23 min   Activity Tolerance Patient tolerated treatment well   Behavior During Therapy [facility] for tasks assessed/performed      Past Medical History  Diagnosis Date  . Myasthenia gravis ([place]) 2007  . Left breast lump   . Myasthenia gravis ([place])   . Vaginal Pap smear, abnormal     Past Surgical History  Procedure Laterality Date  . Cesarean section  [id number]  . Breast surgery Left 1999    lump removed  . Thymectomy  2007  . Thymetomy  2007    There were no vitals filed for this visit.      Subjective Assessment - 08/22/15 1216    Subjective Pt reports no pain or limitation. she feels some tightness with deep stretching.   Pertinent History s/p L ACL reconstruction (hamstring autograft) 06/14/2015 following fall which occurred 05/14/2015. Pt maintained her activity level following fall, chose to have ACL repair to decr. likelihood of future injury rather than due to pain. Pt is active, skis for fun and has two young children which she would like to keep up with.   Limitations Sitting;Lifting;Standing;Walking;House hold activities   How long can you sit comfortably? Currently pt is elevating LE when sitting   How long can you stand comfortably? Only standing with brace.   How long can you walk comfortably? Pt described 86' of walking as "a long walk" currently.    Diagnostic tests imaging, pre/post surgery   Patient Stated Goals Jogging, skiing, and  performing ADLs normally   Currently in Pain? No/denies   Pain Score 0-No pain   Pain Onset More than a month ago               Objective: Reassessed pt  There ex. Performance. Modified squat to incr. Reliance on LLE.  Performed hop variations, single leg and double leg.  Pt wanted to know when she could return to running. PT educated pt on slow careful light run pending return to MD, issued return to running program including walking warmup progressing to 1 min run followed by 4 min walk, progressing through to 3 min run 2 min walk.  Answered pt questions regarding return to other higher level activities.                   PT Education - 08/22/15 1218    Education provided Yes   Education Details Return to running, light plyometrics   Person(s) Educated Patient   Methods Explanation   Comprehension Verbalized understanding             PT Long Term Goals - 08/22/15 1223    PT LONG TERM GOAL #1   Title Pt will be able to flex L knee at least the same amount as the R to allow normal gait and ability to perform ADLs   Baseline 83 deg active flex   Time 8   Period Weeks  Status Achieved   PT LONG TERM GOAL #2   Title Pt will perform 20 reps of SLR on L LE with 0/10 pain or compensation to demonstrate proper quad control for ambulation and ADLs   Baseline Cannot perform SLR wihtout quad lag   Time 8   Period Weeks   Status Achieved   PT LONG TERM GOAL #3   Title Pt will score >40 on the LEFS to indicate a higher level of function.   Baseline 28/80   Time 8   Period Weeks   Status Achieved               Plan - 08/22/15 1222    Clinical Impression Statement Pt has normal gait. with her usual squat is WNL however with wide BOS squat demonstrates preference for WB through RLE, unable to fully correct. performed very light jumps which were performed appropriately even with gentle single leg hop. SLS is WNL and ROM is = oppsite LE. At this  time pt is appropriate for d/c with return to running program pending return to MD this afternoon.   Rehab Potential Good   Clinical Impairments Affecting Rehab Potential Age, first occurrance of injury, myasthenia gravis, good overall health   PT Frequency 2x / week   PT Duration 8 weeks   PT Treatment/Interventions ADLs/Self Care Home Management;Aquatic Therapy;Cryotherapy;Electrical Stimulation;Moist Heat;Balance training;Therapeutic exercise;Stair training;Gait training;Neuromuscular re-education;Patient/family education;Manual techniques;Dry needling;Passive range of motion;Scar mobilization   PT Next Visit Plan Continue exercises and mobilization per protocol   PT Home Exercise Plan SLR, quad sets, calf stretch, PROM   Consulted and Agree with Plan of Care Patient      Patient will benefit from skilled therapeutic intervention in order to improve the following deficits and impairments:  Abnormal gait, Decreased activity tolerance, Decreased balance, Decreased endurance, Decreased mobility, Decreased strength, Decreased skin integrity, Decreased range of motion, Difficulty walking, Hypomobility, Increased edema, Pain  Visit Diagnosis: Muscle weakness (generalized)     Problem List Patient Active Problem List   Diagnosis Date Noted  . 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, [place], [address] Phone: [phone]   Fax:  [phone]  Name: Julie Skinner MRN: [id number] Date of Birth: 04-11-1976

## 2015-08-29 ENCOUNTER — Encounter: Payer: 59 | Admitting: Physical Therapy

## 2015-09-01 ENCOUNTER — Encounter: Payer: Self-pay | Admitting: Obstetrics and Gynecology

## 2015-09-01 ENCOUNTER — Ambulatory Visit (INDEPENDENT_AMBULATORY_CARE_PROVIDER_SITE_OTHER): Payer: BLUE CROSS/BLUE SHIELD | Admitting: Obstetrics and Gynecology

## 2015-09-01 VITALS — BP 132/94 | HR 94 | Ht 63.0 in | Wt 148.4 lb

## 2015-09-01 DIAGNOSIS — E663 Overweight: Secondary | ICD-10-CM | POA: Diagnosis not present

## 2015-09-01 MED ORDER — PHENTERMINE HCL 37.5 MG PO TABS: 37.5000 mg | ORAL_TABLET | Freq: Every day | ORAL | Status: DC

## 2015-09-01 MED ORDER — CYANOCOBALAMIN 1000 MCG/ML IJ SOLN: 1000.0000 ug | Freq: Once | INTRAMUSCULAR | Status: DC

## 2015-09-01 NOTE — Progress Notes (Signed)
SUBJECTIVE:  40 y.o. here for follow-up weight loss visit, previously seen 4 weeks ago. Denies any concerns and feels like medication has worked well, desires to do one more round as has not lost all her weight since knee surgery.  OBJECTIVE:  BP 132/94 mmHg  Pulse 94  Ht 5\' 3"  (1.6 m)  Wt 148 lb 6.4 oz (67.314 kg)  BMI 26.29 kg/m2  Body mass index is 26.29 kg/(m^2). Patient appears well. ASSESSMENT:  Obesity- responding well to weight loss plan PLAN:  To continue with current medications. B12 1068mcg/ml injection given RTC in 4 weeks as planned  Tanuj Mullens [place], CNM

## 2015-09-19 ENCOUNTER — Ambulatory Visit: Payer: BLUE CROSS/BLUE SHIELD | Admitting: Physical Therapy

## 2015-09-30 ENCOUNTER — Ambulatory Visit (INDEPENDENT_AMBULATORY_CARE_PROVIDER_SITE_OTHER): Payer: BLUE CROSS/BLUE SHIELD | Admitting: Obstetrics and Gynecology

## 2015-09-30 VITALS — BP 137/80 | HR 91 | Ht 63.0 in | Wt 144.9 lb

## 2015-09-30 DIAGNOSIS — E663 Overweight: Secondary | ICD-10-CM | POA: Diagnosis not present

## 2015-09-30 MED ORDER — CYANOCOBALAMIN 1000 MCG/ML IJ SOLN
1000.0000 ug | Freq: Once | INTRAMUSCULAR | Status: AC
  Administered 2015-09-30: 1000 ug via INTRAMUSCULAR

## 2015-09-30 NOTE — Progress Notes (Signed)
Patient ID: Julie Skinner, female   DOB: 09-02-1975, 40 y.o.   MRN: [id number] Pt presents for wt, bp, and b12 inj. Down 4#. NO s/e.

## 2015-10-17 ENCOUNTER — Ambulatory Visit: Payer: BLUE CROSS/BLUE SHIELD | Attending: Orthopedic Surgery | Admitting: Physical Therapy

## 2015-10-17 DIAGNOSIS — M6281 Muscle weakness (generalized): Secondary | ICD-10-CM

## 2015-10-17 NOTE — Therapy (Signed)
[facility] [address], [place], [address] Phone: [phone]   Fax:  [phone]  Patient Details  Name: CARLEA LUMPKINS MRN: [id number] Date of Birth: May 21, 1975 Referring Provider:  Bernette Redbird, MD  Encounter Date: 10/17/2015   Pt seen for short followup. She went dancing and is having pain now and mild swelling. ROM is still WNL, gait is WNL. PT encouraged pt to speak with MD regarding mild swelling. Also encouraged pt to continue with moderate HEP as pain continues to improve using rest and ice. Pt verbalized understanding of this. Pt had to go to work so discontinued.  Fisher,Benjamin PT DPT 10/17/2015, 4:06 PM  [facility] [address] [place], [address] Phone: [phone]   Fax:  [phone]

## 2015-10-20 ENCOUNTER — Encounter: Payer: 59 | Admitting: Obstetrics and Gynecology

## 2015-10-27 ENCOUNTER — Encounter: Payer: Self-pay | Admitting: Obstetrics and Gynecology

## 2015-10-27 ENCOUNTER — Ambulatory Visit (INDEPENDENT_AMBULATORY_CARE_PROVIDER_SITE_OTHER): Payer: BLUE CROSS/BLUE SHIELD | Admitting: Obstetrics and Gynecology

## 2015-10-27 VITALS — BP 122/81 | HR 95 | Wt 147.5 lb

## 2015-10-27 DIAGNOSIS — G7000 Myasthenia gravis without (acute) exacerbation: Secondary | ICD-10-CM | POA: Insufficient documentation

## 2015-10-27 DIAGNOSIS — Z01419 Encounter for gynecological examination (general) (routine) without abnormal findings: Secondary | ICD-10-CM

## 2015-10-27 DIAGNOSIS — E663 Overweight: Secondary | ICD-10-CM | POA: Diagnosis not present

## 2015-10-27 MED ORDER — METRONIDAZOLE 0.75 % VA GEL: 1.0000 | Freq: Every day | VAGINAL | Status: DC

## 2015-10-27 NOTE — Patient Instructions (Signed)
  Place annual gynecologic exam patient instructions here.  Thank you for enrolling in [facility]. Please follow the instructions below to securely access your online medical record. MyChart allows you to send messages to your doctor, view your test results, manage appointments, and more.   How Do I Sign Up? 1. In your Internet browser, go to [employer] and enter [email or web address]. 2. Click on the Sign Up Now link in the Sign In box. You will see the New Member Sign Up page. 3. Enter your MyChart Access Code exactly as it appears below. You will not need to use this code after you've completed the sign-up process. If you do not sign up before the expiration date, you must request a new code.  MyChart Access Code: [email or web address] Expires: 11/14/2015  8:58 AM  4. Enter your Social Security Number ([id number]) and Date of Birth (mm/dd/yyyy) as indicated and click Submit. You will be taken to the next sign-up page. 5. Create a MyChart ID. This will be your MyChart login ID and cannot be changed, so think of one that is secure and easy to remember. 6. Create a MyChart password. You can change your password at any time. 7. Enter your Password Reset Question and Answer. This can be used at a later time if you forget your password.  8. Enter your e-mail address. You will receive e-mail notification when new information is available in [facility]. 9. Click Sign Up. You can now view your medical record.   Additional Information Remember, MyChart is NOT to be used for urgent needs. For medical emergencies, dial 911.

## 2015-10-27 NOTE — Progress Notes (Signed)
Subjective:   Julie Skinner is a 40 y.o. G69P2 Caucasian female here for a routine well-woman exam.  No LMP recorded. Patient is not currently having periods (Reason: IUD).    Current complaints: occasional BV after sex, uses Metrogel PRN, needs refill PCP: [facility]     does desire labs  Social History: Sexual: heterosexual Marital Status: married Living situation: with family Occupation: Research officer, political party Tobacco/alcohol: no tobacco use Illicit drugs: no history of illicit drug use  The following portions of the patient's history were reviewed and updated as appropriate: allergies, current medications, past family history, past medical history, past social history, past surgical history and problem list.  Past Medical History Past Medical History  Diagnosis Date  . Myasthenia gravis ([place]) 2007  . Left breast lump   . Myasthenia gravis ([place])   . Vaginal Pap smear, abnormal     Past Surgical History Past Surgical History  Procedure Laterality Date  . Cesarean section  [id number]  . Breast surgery Left 1999    lump removed  . Thymectomy  2007  . Thymetomy  2007    Gynecologic History G2P2  No LMP recorded. Patient is not currently having periods (Reason: IUD). Contraception: IUD Last Pap: 2016. Results were: normal Last mammogram: 2016. Results were: normal  Obstetric History OB History  Gravida Para Term Preterm AB SAB TAB Ectopic Multiple Living  2 2        2     # Outcome Date GA Lbr Len/2nd Weight Sex Delivery Anes PTL Lv  2 Para           1 Para             Obstetric Comments  1st Menstrual Cycle: 10  1st Pregnancy:  29    Current Medications Current Outpatient Prescriptions on File Prior to Visit  Medication Sig Dispense Refill  . cholecalciferol (VITAMIN D) 1000 UNITS tablet Take 1,000 Units by mouth as needed. Reported on 05/04/2015    . cyanocobalamin (,VITAMIN B-12,) 1000 MCG/ML injection Inject 1 mL (1,000 mcg total) into the muscle once. 3 mL 2  .  gabapentin (NEURONTIN) 300 MG capsule Take 300 mg by mouth as needed. Reported on 09/01/2015    . levonorgestrel (MIRENA) 20 MCG/24HR IUD 1 each by Intrauterine route once.    . phentermine (ADIPEX-P) 37.5 MG tablet Take 1 tablet (37.5 mg total) by mouth daily before breakfast. 30 tablet 2  . traMADol (ULTRAM) 50 MG tablet Take 50 mg by mouth every 6 (six) hours as needed. Reported on 09/01/2015     No current facility-administered medications on file prior to visit.    Review of Systems Patient denies any headaches, blurred vision, shortness of breath, chest pain, abdominal pain, problems with bowel movements, urination, or intercourse.  Objective:  BP 122/81 mmHg  Pulse 95  Wt 147 lb 8 oz (66.906 kg) Physical Exam  General:  Well developed, well nourished, no acute distress. She is alert and oriented x3. Skin:  Warm and dry Neck:  Midline trachea, no thyromegaly or nodules Cardiovascular: Regular rate and rhythm, no murmur heard Lungs:  Effort normal, all lung fields clear to auscultation bilaterally Breasts:  No dominant palpable mass, retraction, or nipple discharge Abdomen:  Soft, non tender, no hepatosplenomegaly or masses Pelvic:  External genitalia is normal in appearance.  The vagina is normal in appearance. The cervix is bulbous, no CMT. IUD string noted.  Thin prep pap is not done . Uterus is felt to be normal  size, shape, and contour.  No adnexal masses or tenderness noted. Extremities:  No swelling or varicosities noted Psych:  She has a normal mood and affect  Assessment:   Healthy well-woman exam Overweight IUD check-up  Plan:  metrogel refilled F/U 1 year for AE, or sooner if needed Mammogram scheduled  Ridhi Hoffert [place], CNM

## 2015-10-28 LAB — LIPID PANEL
Chol/HDL Ratio: 2.3 ratio units (ref 0.0–4.4)
Cholesterol, Total: 189 mg/dL (ref 100–199)
HDL: 83 mg/dL (ref >39)
LDL Calculated: 96 mg/dL (ref 0–99)
Triglycerides: 48 mg/dL (ref 0–149)
VLDL Cholesterol Cal: 10 mg/dL (ref 5–40)

## 2015-10-28 LAB — COMPREHENSIVE METABOLIC PANEL
A/G RATIO: 1.8 (ref 1.2–2.2)
ALT: 17 IU/L (ref 0–32)
AST: 17 IU/L (ref 0–40)
Albumin: 4.6 g/dL (ref 3.5–5.5)
Alkaline Phosphatase: 44 IU/L (ref 39–117)
BILIRUBIN TOTAL: 0.4 mg/dL (ref 0.0–1.2)
BUN / CREAT RATIO: 14 (ref 9–23)
BUN: 11 mg/dL (ref 6–24)
CHLORIDE: 99 mmol/L (ref 96–106)
CO2: 24 mmol/L (ref 18–29)
Calcium: 9.1 mg/dL (ref 8.7–10.2)
Creatinine, Ser: 0.77 mg/dL (ref 0.57–1.00)
GFR calc non Af Amer: 96 mL/min/{1.73_m2} (ref >59)
GFR, EST AFRICAN AMERICAN: 110 mL/min/{1.73_m2} (ref >59)
GLOBULIN, TOTAL: 2.6 g/dL (ref 1.5–4.5)
Glucose: 79 mg/dL (ref 65–99)
POTASSIUM: 4.7 mmol/L (ref 3.5–5.2)
SODIUM: 140 mmol/L (ref 134–144)
TOTAL PROTEIN: 7.2 g/dL (ref 6.0–8.5)

## 2015-10-28 LAB — VITAMIN D 25 HYDROXY (VIT D DEFICIENCY, FRACTURES): VIT D 25 HYDROXY: 33.3 ng/mL (ref 30.0–100.0)

## 2015-11-01 ENCOUNTER — Ambulatory Visit (INDEPENDENT_AMBULATORY_CARE_PROVIDER_SITE_OTHER): Payer: BLUE CROSS/BLUE SHIELD | Admitting: Obstetrics and Gynecology

## 2015-11-01 VITALS — BP 124/97 | HR 97 | Resp 93 | Wt 146.5 lb

## 2015-11-01 DIAGNOSIS — E663 Overweight: Secondary | ICD-10-CM | POA: Diagnosis not present

## 2015-11-01 MED ORDER — CYANOCOBALAMIN 1000 MCG/ML IJ SOLN
1000.0000 ug | Freq: Once | INTRAMUSCULAR | Status: AC
  Administered 2015-11-01: 1000 ug via INTRAMUSCULAR

## 2015-11-01 NOTE — Progress Notes (Signed)
Patient ID: Julie Skinner, female   DOB: 1976-05-01, 40 y.o.   MRN: [id number] Pt presents for weight, B/P, B-12 injection. No side effects of medication-Phentermine, or B-12.  Weight loss of 1 lbs. Encouraged eating healthy and exercise.

## 2015-11-14 ENCOUNTER — Encounter: Payer: 59 | Admitting: Physical Therapy

## 2015-11-29 ENCOUNTER — Ambulatory Visit (INDEPENDENT_AMBULATORY_CARE_PROVIDER_SITE_OTHER): Payer: BLUE CROSS/BLUE SHIELD | Admitting: Obstetrics and Gynecology

## 2015-11-29 VITALS — BP 130/86 | HR 79 | Wt 149.1 lb

## 2015-11-29 DIAGNOSIS — E663 Overweight: Secondary | ICD-10-CM | POA: Diagnosis not present

## 2015-11-29 MED ORDER — CYANOCOBALAMIN 1000 MCG/ML IJ SOLN
1000.0000 ug | Freq: Once | INTRAMUSCULAR | Status: AC
  Administered 2015-11-29: 1000 ug via INTRAMUSCULAR

## 2015-11-29 NOTE — Progress Notes (Signed)
Pt presents for weight, B/P, B-12 injection. No side effects of medication-Phentermine, or B-12.  Weight gain of 3 lbs. Encouraged eating healthy and exercise.

## 2016-01-04 ENCOUNTER — Ambulatory Visit (INDEPENDENT_AMBULATORY_CARE_PROVIDER_SITE_OTHER): Payer: BLUE CROSS/BLUE SHIELD | Admitting: Obstetrics and Gynecology

## 2016-01-04 ENCOUNTER — Encounter: Payer: Self-pay | Admitting: Obstetrics and Gynecology

## 2016-01-04 VITALS — BP 122/84 | HR 98 | Ht 63.5 in | Wt 146.4 lb

## 2016-01-04 DIAGNOSIS — E663 Overweight: Secondary | ICD-10-CM

## 2016-01-04 MED ORDER — CYANOCOBALAMIN 1000 MCG/ML IJ SOLN: 1000.0000 ug | Freq: Once | INTRAMUSCULAR | 2 refills | Status: AC

## 2016-01-04 MED ORDER — PHENTERMINE HCL 37.5 MG PO TABS
37.5000 mg | ORAL_TABLET | Freq: Every day | ORAL | 2 refills | Status: DC
Start: 2016-01-04 — End: 2016-06-14

## 2016-01-04 NOTE — Progress Notes (Signed)
SUBJECTIVE:  40 y.o. here for follow-up weight loss visit, previously seen 4 weeks ago. Denies any concerns and feels like medication worked well, but has just been released to exercise since knee surgery. Desires restarting to get last 7-10# off  OBJECTIVE:  BP 122/84   Pulse 98   Ht 5' 3.5" (1.613 m)   Wt 146 lb 6.4 oz (66.4 kg)   BMI 25.53 kg/m   Body mass index is 25.53 kg/m. Patient appears well. ASSESSMENT:  Obesity- responding well to weight loss plan PLAN:  To continue with current medications. B12 104mcg/ml injection given RTC in 4 weeks as planned  Melody [place], CNM

## 2016-01-31 ENCOUNTER — Ambulatory Visit (INDEPENDENT_AMBULATORY_CARE_PROVIDER_SITE_OTHER): Payer: BLUE CROSS/BLUE SHIELD | Admitting: Obstetrics and Gynecology

## 2016-01-31 VITALS — BP 135/91 | HR 90 | Ht 63.5 in | Wt 146.7 lb

## 2016-01-31 DIAGNOSIS — E663 Overweight: Secondary | ICD-10-CM | POA: Diagnosis not present

## 2016-01-31 MED ORDER — CYANOCOBALAMIN 1000 MCG/ML IJ SOLN
1000.0000 ug | Freq: Once | INTRAMUSCULAR | Status: AC
  Administered 2016-01-31: 1000 ug via INTRAMUSCULAR

## 2016-01-31 NOTE — Progress Notes (Signed)
Patient ID: Julie Skinner, female   DOB: 09/09/1975, 41 y.o.   MRN: [id number]  Pt presents for weight, B/P, B-12 injection. No side effects of medication-Phentermine, or B-12.  Weight stable at  146 lbs. Encouraged eating healthy and exercise.

## 2016-02-07 ENCOUNTER — Encounter: Payer: Self-pay | Admitting: Obstetrics and Gynecology

## 2016-02-28 ENCOUNTER — Ambulatory Visit: Payer: BLUE CROSS/BLUE SHIELD

## 2016-02-29 ENCOUNTER — Ambulatory Visit: Payer: BLUE CROSS/BLUE SHIELD

## 2016-02-29 VITALS — BP 136/84 | HR 95 | Wt 149.4 lb

## 2016-02-29 DIAGNOSIS — E663 Overweight: Secondary | ICD-10-CM

## 2016-02-29 MED ORDER — CYANOCOBALAMIN 1000 MCG/ML IJ SOLN
1000.0000 ug | Freq: Once | INTRAMUSCULAR | Status: AC
  Administered 2016-02-29: 1000 ug via INTRAMUSCULAR

## 2016-02-29 NOTE — Progress Notes (Unsigned)
Pt is here for wt, bp check,b-12 inj She is doing well  01/31/16 wt-146lb 01/04/16 wt-146lb

## 2016-03-21 ENCOUNTER — Telehealth: Payer: Self-pay | Admitting: Obstetrics and Gynecology

## 2016-03-21 NOTE — Telephone Encounter (Signed)
Patient has a question regarding her upcoming mammogram. She would like a call back.Thanks

## 2016-03-21 NOTE — Telephone Encounter (Signed)
Called pt.

## 2016-03-28 ENCOUNTER — Ambulatory Visit: Payer: BLUE CROSS/BLUE SHIELD

## 2016-04-02 ENCOUNTER — Ambulatory Visit (INDEPENDENT_AMBULATORY_CARE_PROVIDER_SITE_OTHER): Payer: BLUE CROSS/BLUE SHIELD | Admitting: Obstetrics and Gynecology

## 2016-04-02 ENCOUNTER — Encounter: Payer: Self-pay | Admitting: Obstetrics and Gynecology

## 2016-04-02 VITALS — BP 141/94 | HR 82 | Ht 63.0 in | Wt 148.1 lb

## 2016-04-02 DIAGNOSIS — E663 Overweight: Secondary | ICD-10-CM | POA: Diagnosis not present

## 2016-04-02 MED ORDER — CYANOCOBALAMIN 1000 MCG/ML IJ SOLN
1000.0000 ug | Freq: Once | INTRAMUSCULAR | Status: AC
  Administered 2016-04-02: 1000 ug via INTRAMUSCULAR

## 2016-04-02 NOTE — Progress Notes (Signed)
Patient ID: Julie Skinner, female   DOB: 1975/09/18, 40 y.o.   MRN: [id number]  Pt presents for weight, B/P, B-12 injection. No side effects of medication-Phentermine, or B-12.  Weight loss of _1_ lbs. Encouraged eating healthy and exercise. B/P 144/96, 2nd time B/P 141/94. Pt states she has been stressed due to work and family today. To keep a check on her B/P at home. To contact office if elevated B/P continues, she may need to stop phentermine.

## 2016-05-10 ENCOUNTER — Encounter: Payer: BLUE CROSS/BLUE SHIELD | Admitting: Obstetrics and Gynecology

## 2016-05-24 ENCOUNTER — Encounter: Payer: BLUE CROSS/BLUE SHIELD | Admitting: Obstetrics and Gynecology

## 2016-06-14 ENCOUNTER — Encounter: Payer: Self-pay | Admitting: Obstetrics and Gynecology

## 2016-06-14 ENCOUNTER — Ambulatory Visit (INDEPENDENT_AMBULATORY_CARE_PROVIDER_SITE_OTHER): Payer: BLUE CROSS/BLUE SHIELD | Admitting: Obstetrics and Gynecology

## 2016-06-14 VITALS — BP 140/84 | HR 98 | Ht 64.0 in | Wt 151.1 lb

## 2016-06-14 DIAGNOSIS — E663 Overweight: Secondary | ICD-10-CM | POA: Diagnosis not present

## 2016-06-14 MED ORDER — PHENTERMINE HCL 37.5 MG PO TABS: 37.5000 mg | ORAL_TABLET | Freq: Every day | ORAL | 2 refills | Status: DC

## 2016-06-14 NOTE — Progress Notes (Signed)
SUBJECTIVE:  41 y.o. here for follow-up weight loss visit, previously seen 4 weeks ago. Denies any concerns and feels like medication has worked well in past, but frustrated over current lack of weight loss. Did just get cleared from knee surgery to return to normal activity (used to run) and plans to restart it.   OBJECTIVE:  BP 140/84   Pulse 98   Ht 5\' 4"  (1.626 m)   Wt 151 lb 1.6 oz (68.5 kg)   BMI 25.94 kg/m   Body mass index is 25.94 kg/m. Patient appears well. ASSESSMENT:  Overweight PLAN:  To continue with current medications. B12 1043mcg/ml injection given RTC in 4 weeks as planned. Recommend adding detox tea every couple months.  Laurey Salser [place], CNM

## 2016-07-12 ENCOUNTER — Ambulatory Visit: Payer: BLUE CROSS/BLUE SHIELD

## 2016-07-12 ENCOUNTER — Ambulatory Visit (INDEPENDENT_AMBULATORY_CARE_PROVIDER_SITE_OTHER): Payer: BLUE CROSS/BLUE SHIELD | Admitting: Obstetrics and Gynecology

## 2016-07-12 VITALS — BP 143/84 | HR 92 | Wt 153.0 lb

## 2016-07-12 DIAGNOSIS — E663 Overweight: Secondary | ICD-10-CM

## 2016-07-12 MED ORDER — CYANOCOBALAMIN 1000 MCG/ML IJ SOLN
1000.0000 ug | Freq: Once | INTRAMUSCULAR | Status: AC
  Administered 2016-07-12: 1000 ug via INTRAMUSCULAR

## 2016-07-12 NOTE — Progress Notes (Signed)
Pt is here for wt, bp check, b-12 inj. She isn't exercising much, having trouble with her feet, ankle.   07/12/16 wt- .153lb 06/14/16 wt- 151lb

## 2016-08-09 ENCOUNTER — Ambulatory Visit: Payer: BLUE CROSS/BLUE SHIELD | Admitting: Obstetrics and Gynecology

## 2016-08-13 ENCOUNTER — Ambulatory Visit (INDEPENDENT_AMBULATORY_CARE_PROVIDER_SITE_OTHER): Payer: BLUE CROSS/BLUE SHIELD | Admitting: Obstetrics and Gynecology

## 2016-08-13 VITALS — BP 131/96 | HR 83 | Ht 64.0 in | Wt 148.2 lb

## 2016-08-13 DIAGNOSIS — E663 Overweight: Secondary | ICD-10-CM

## 2016-08-13 MED ORDER — CYANOCOBALAMIN 1000 MCG/ML IJ SOLN
1000.0000 ug | Freq: Once | INTRAMUSCULAR | Status: AC
  Administered 2016-08-13: 1000 ug via INTRAMUSCULAR

## 2016-08-13 NOTE — Progress Notes (Signed)
Patient ID: Julie Skinner, female   DOB: 1975-06-27, 41 y.o.   MRN: [id number] Pt presents for weight, B/P, B-12 injection. No side effects of medication-Phentermine, or B-12.  Weight loss of 5 lbs. Encouraged eating healthy and exercise.  B/P-131/96, P-83. Pt encouraged to take B/P at home, it may be today because she just went back to work after vacation.

## 2016-09-10 ENCOUNTER — Ambulatory Visit (INDEPENDENT_AMBULATORY_CARE_PROVIDER_SITE_OTHER): Payer: BLUE CROSS/BLUE SHIELD | Admitting: Obstetrics and Gynecology

## 2016-09-10 VITALS — BP 148/102 | HR 80 | Wt 148.1 lb

## 2016-09-10 DIAGNOSIS — E663 Overweight: Secondary | ICD-10-CM | POA: Diagnosis not present

## 2016-09-10 MED ORDER — CYANOCOBALAMIN 1000 MCG/ML IJ SOLN: 1000.0000 ug | Freq: Once | INTRAMUSCULAR | Status: AC

## 2016-09-10 NOTE — Progress Notes (Signed)
Pt presents for  Weight,B/P, B-12 injection. No side effects of medications-Phentermine or B-12. Weight gain _2___ oz. Encourage eating healthy and exercise. BP continues to rise. Pt is c/o headaches and is concerned about BP. Will make 4 week followup with Melody to discuss stopping adipex. Lot#[id number] Exp 06/2017 left deltoid

## 2016-10-30 ENCOUNTER — Encounter: Payer: Self-pay | Admitting: Obstetrics and Gynecology

## 2016-10-30 ENCOUNTER — Ambulatory Visit (INDEPENDENT_AMBULATORY_CARE_PROVIDER_SITE_OTHER): Payer: BLUE CROSS/BLUE SHIELD | Admitting: Obstetrics and Gynecology

## 2016-10-30 VITALS — BP 132/96 | HR 74 | Ht 64.0 in | Wt 150.0 lb

## 2016-10-30 DIAGNOSIS — I10 Essential (primary) hypertension: Secondary | ICD-10-CM | POA: Diagnosis not present

## 2016-10-30 DIAGNOSIS — G7000 Myasthenia gravis without (acute) exacerbation: Secondary | ICD-10-CM

## 2016-10-30 DIAGNOSIS — Z01419 Encounter for gynecological examination (general) (routine) without abnormal findings: Secondary | ICD-10-CM

## 2016-10-30 MED ORDER — LOSARTAN POTASSIUM 50 MG PO TABS: 50.0000 mg | ORAL_TABLET | Freq: Every day | ORAL | 6 refills | Status: DC

## 2016-10-30 NOTE — Patient Instructions (Addendum)
Preventive Care 18-39 Years, Female Preventive care refers to lifestyle choices and visits with your health care provider that can promote health and wellness. What does preventive care include?  A yearly physical exam. This is also called an annual well check.  Dental exams once or twice a year.  Routine eye exams. Ask your health care provider how often you should have your eyes checked.  Personal lifestyle choices, including: ? Daily care of your teeth and gums. ? Regular physical activity. ? Eating a healthy diet. ? Avoiding tobacco and drug use. ? Limiting alcohol use. ? Practicing safe sex. ? Taking vitamin and mineral supplements as recommended by your health care provider. What happens during an annual well check? The services and screenings done by your health care provider during your annual well check will depend on your age, overall health, lifestyle risk factors, and family history of disease. Counseling Your health care provider may ask you questions about your:  Alcohol use.  Tobacco use.  Drug use.  Emotional well-being.  Home and relationship well-being.  Sexual activity.  Eating habits.  Work and work Statistician.  Method of birth control.  Menstrual cycle.  Pregnancy history.  Screening You may have the following tests or measurements:  Height, weight, and BMI.  Diabetes screening. This is done by checking your blood sugar (glucose) after you have not eaten for a while (fasting).  Blood pressure.  Lipid and cholesterol levels. These may be checked every 5 years starting at age 38.  Skin check.  Hepatitis C blood test.  Hepatitis B blood test.  Sexually transmitted disease (STD) testing.  BRCA-related cancer screening. This may be done if you have a family history of breast, ovarian, tubal, or peritoneal cancers.  Pelvic exam and Pap test. This may be done every 3 years starting at age 38. Starting at age 30, this may be done  every 5 years if you have a Pap test in combination with an HPV test.  Discuss your test results, treatment options, and if necessary, the need for more tests with your health care provider. Vaccines Your health care provider may recommend certain vaccines, such as:  Influenza vaccine. This is recommended every year.  Tetanus, diphtheria, and acellular pertussis (Tdap, Td) vaccine. You may need a Td booster every 10 years.  Varicella vaccine. You may need this if you have not been vaccinated.  HPV vaccine. If you are 39 or younger, you may need three doses over 6 months.  Measles, mumps, and rubella (MMR) vaccine. You may need at least one dose of MMR. You may also need a second dose.  Pneumococcal 13-valent conjugate (PCV13) vaccine. You may need this if you have certain conditions and were not previously vaccinated.  Pneumococcal polysaccharide (PPSV23) vaccine. You may need one or two doses if you smoke cigarettes or if you have certain conditions.  Meningococcal vaccine. One dose is recommended if you are age 68-21 years and a first-year college student living in a residence hall, or if you have one of several medical conditions. You may also need additional booster doses.  Hepatitis A vaccine. You may need this if you have certain conditions or if you travel or work in places where you may be exposed to hepatitis A.  Hepatitis B vaccine. You may need this if you have certain conditions or if you travel or work in places where you may be exposed to hepatitis B.  Haemophilus influenzae type b (Hib) vaccine. You may need this  if you have certain risk factors.  Talk to your health care provider about which screenings and vaccines you need and how often you need them. This information is not intended to replace advice given to you by your health care provider. Make sure you discuss any questions you have with your health care provider. Document Released: 06/19/2001 Document Revised:  01/11/2016 Document Reviewed: 02/22/2015 Elsevier Interactive Patient Education  2017 Elsevier Inc.    Hypertension Hypertension, commonly called high blood pressure, is when the force of blood pumping through the arteries is too strong. The arteries are the blood vessels that carry blood from the heart throughout the body. Hypertension forces the heart to work harder to pump blood and may cause arteries to become narrow or stiff. Having untreated or uncontrolled hypertension can cause heart attacks, strokes, kidney disease, and other problems. A blood pressure reading consists of a higher number over a lower number. Ideally, your blood pressure should be below 120/80. The first ("top") number is called the systolic pressure. It is a measure of the pressure in your arteries as your heart beats. The second ("bottom") number is called the diastolic pressure. It is a measure of the pressure in your arteries as the heart relaxes. What are the causes? The cause of this condition is not known. What increases the risk? Some risk factors for high blood pressure are under your control. Others are not. Factors you can change  Smoking.  Having type 2 diabetes mellitus, high cholesterol, or both.  Not getting enough exercise or physical activity.  Being overweight.  Having too much fat, sugar, calories, or salt (sodium) in your diet.  Drinking too much alcohol. Factors that are difficult or impossible to change  Having chronic kidney disease.  Having a family history of high blood pressure.  Age. Risk increases with age.  Race. You may be at higher risk if you are African-American.  Gender. Men are at higher risk than women before age 16. After age 22, women are at higher risk than men.  Having obstructive sleep apnea.  Stress. What are the signs or symptoms? Extremely high blood pressure (hypertensive crisis) may cause:  Headache.  Anxiety.  Shortness of  breath.  Nosebleed.  Nausea and vomiting.  Severe chest pain.  Jerky movements you cannot control (seizures).  How is this diagnosed? This condition is diagnosed by measuring your blood pressure while you are seated, with your arm resting on a surface. The cuff of the blood pressure monitor will be placed directly against the skin of your upper arm at the level of your heart. It should be measured at least twice using the same arm. Certain conditions can cause a difference in blood pressure between your right and left arms. Certain factors can cause blood pressure readings to be lower or higher than normal (elevated) for a short period of time:  When your blood pressure is higher when you are in a health care provider's office than when you are at home, this is called white coat hypertension. Most people with this condition do not need medicines.  When your blood pressure is higher at home than when you are in a health care provider's office, this is called masked hypertension. Most people with this condition may need medicines to control blood pressure.  If you have a high blood pressure reading during one visit or you have normal blood pressure with other risk factors:  You may be asked to return on a different day to have  your blood pressure checked again.  You may be asked to monitor your blood pressure at home for 1 week or longer.  If you are diagnosed with hypertension, you may have other blood or imaging tests to help your health care provider understand your overall risk for other conditions. How is this treated? This condition is treated by making healthy lifestyle changes, such as eating healthy foods, exercising more, and reducing your alcohol intake. Your health care provider may prescribe medicine if lifestyle changes are not enough to get your blood pressure under control, and if:  Your systolic blood pressure is above 130.  Your diastolic blood pressure is above  80.  Your personal target blood pressure may vary depending on your medical conditions, your age, and other factors. Follow these instructions at home: Eating and drinking  Eat a diet that is high in fiber and potassium, and low in sodium, added sugar, and fat. An example eating plan is called the DASH (Dietary Approaches to Stop Hypertension) diet. To eat this way: ? Eat plenty of fresh fruits and vegetables. Try to fill half of your plate at each meal with fruits and vegetables. ? Eat whole grains, such as whole wheat pasta, brown rice, or whole grain bread. Fill about one quarter of your plate with whole grains. ? Eat or drink low-fat dairy products, such as skim milk or low-fat yogurt. ? Avoid fatty cuts of meat, processed or cured meats, and poultry with skin. Fill about one quarter of your plate with lean proteins, such as fish, chicken without skin, beans, eggs, and tofu. ? Avoid premade and processed foods. These tend to be higher in sodium, added sugar, and fat.  Reduce your daily sodium intake. Most people with hypertension should eat less than 1,500 mg of sodium a day.  Limit alcohol intake to no more than 1 drink a day for nonpregnant women and 2 drinks a day for men. One drink equals 12 oz of beer, 5 oz of wine, or 1 oz of hard liquor. Lifestyle  Work with your health care provider to maintain a healthy body weight or to lose weight. Ask what an ideal weight is for you.  Get at least 30 minutes of exercise that causes your heart to beat faster (aerobic exercise) most days of the week. Activities may include walking, swimming, or biking.  Include exercise to strengthen your muscles (resistance exercise), such as pilates or lifting weights, as part of your weekly exercise routine. Try to do these types of exercises for 30 minutes at least 3 days a week.  Do not use any products that contain nicotine or tobacco, such as cigarettes and e-cigarettes. If you need help quitting, ask  your health care provider.  Monitor your blood pressure at home as told by your health care provider.  Keep all follow-up visits as told by your health care provider. This is important. Medicines  Take over-the-counter and prescription medicines only as told by your health care provider. Follow directions carefully. Blood pressure medicines must be taken as prescribed.  Do not skip doses of blood pressure medicine. Doing this puts you at risk for problems and can make the medicine less effective.  Ask your health care provider about side effects or reactions to medicines that you should watch for. Contact a health care provider if:  You think you are having a reaction to a medicine you are taking.  You have headaches that keep coming back (recurring).  You feel dizzy.  You  have swelling in your ankles.  You have trouble with your vision. Get help right away if:  You develop a severe headache or confusion.  You have unusual weakness or numbness.  You feel faint.  You have severe pain in your chest or abdomen.  You vomit repeatedly.  You have trouble breathing. Summary  Hypertension is when the force of blood pumping through your arteries is too strong. If this condition is not controlled, it may put you at risk for serious complications.  Your personal target blood pressure may vary depending on your medical conditions, your age, and other factors. For most people, a normal blood pressure is less than 120/80.  Hypertension is treated with lifestyle changes, medicines, or a combination of both. Lifestyle changes include weight loss, eating a healthy, low-sodium diet, exercising more, and limiting alcohol. This information is not intended to replace advice given to you by your health care provider. Make sure you discuss any questions you have with your health care provider. Document Released: 04/23/2005 Document Revised: 03/21/2016 Document Reviewed: 03/21/2016 Elsevier  Interactive Patient Education  [employer].

## 2016-10-30 NOTE — Progress Notes (Signed)
Subjective:   Julie Skinner is a 41 y.o. G30P2 Caucasian female here for a routine well-woman exam.  No LMP recorded. Patient is not currently having periods (Reason: IUD).    Current complaints: concerned about elevated BP, stopped adipex 6 weeks ago to see if it helped. Has changed exercise and diet.  Both parents are on BP medications.  PCP: me       Does need routine labs  Social History: Sexual: heterosexual Marital Status: married Living situation: with family Occupation: Geophysicist/field seismologist at [employer]: no tobacco use Illicit drugs: no history of illicit drug use  The following portions of the patient's history were reviewed and updated as appropriate: allergies, current medications, past family history, past medical history, past social history, past surgical history and problem list.  Past Medical History Past Medical History:  Diagnosis Date  . Left breast lump   . Myasthenia gravis ([place]) 2007  . Myasthenia gravis ([place])   . Vaginal Pap smear, abnormal     Past Surgical History Past Surgical History:  Procedure Laterality Date  . BREAST SURGERY Left 1999   lump removed  . CESAREAN SECTION  [id number]  . THYMECTOMY  2007  . thymetomy  2007    Gynecologic History G2P2  No LMP recorded. Patient is not currently having periods (Reason: IUD). Contraception: IUD Last Pap: 2016. Results were: normal Last mammogram: 03/2016. Results were: abnormal with f/u scans normal  Obstetric History OB History  Gravida Para Term Preterm AB Living  2 2       2   SAB TAB Ectopic Multiple Live Births               # Outcome Date GA Lbr Len/2nd Weight Sex Delivery Anes PTL Lv  2 Para           1 Para             Obstetric Comments  1st Menstrual Cycle: 10  1st Pregnancy:  29    Current Medications Current Outpatient Prescriptions on File Prior to Visit  Medication Sig Dispense Refill  . cholecalciferol (VITAMIN D) 1000 UNITS tablet Take 1,000 Units by mouth as  needed. Reported on 05/04/2015    . levonorgestrel (MIRENA) 20 MCG/24HR IUD 1 each by Intrauterine route once.    . cyanocobalamin (,VITAMIN B-12,) 1000 MCG/ML injection Inject into the muscle.    . gabapentin (NEURONTIN) 600 MG tablet Take 2 tablets at bedtime    . phentermine (ADIPEX-P) 37.5 MG tablet Take 1 tablet (37.5 mg total) by mouth daily before breakfast. (Patient not taking: Reported on 10/30/2016) 30 tablet 2  . traMADol (ULTRAM) 50 MG tablet Take 50 mg by mouth every 6 (six) hours as needed. Reported on 09/01/2015     Current Facility-Administered Medications on File Prior to Visit  Medication Dose Route Frequency Provider Last Rate Last Dose  . cyanocobalamin ((VITAMIN B-12)) injection 1,000 mcg  1,000 mcg Intramuscular Once [place], Alexya Mcdaris N, CNM        Review of Systems Patient denies any headaches, blurred vision, shortness of breath, chest pain, abdominal pain, problems with bowel movements, urination, or intercourse.  Objective:  BP (!) 132/96   Pulse 74   Ht 5\' 4"  (1.626 m)   Wt 150 lb (68 kg)   BMI 25.75 kg/m  Physical Exam  General:  Well developed, well nourished, no acute distress. She is alert and oriented x3. Skin:  Warm and dry Neck:  Midline trachea, no thyromegaly or nodules  Cardiovascular: Regular rate and rhythm, no murmur heard Lungs:  Effort normal, all lung fields clear to auscultation bilaterally Breasts:  No dominant palpable mass, retraction, or nipple discharge Abdomen:  Soft, non tender, no hepatosplenomegaly or masses Pelvic:  External genitalia is normal in appearance.  The vagina is normal in appearance. The cervix is bulbous, no CMT, IUD string noted inside cervix.  Thin prep pap is not done . Uterus is felt to be normal size, shape, and contour.  No adnexal masses or tenderness noted. Extremities:  No swelling or varicosities noted Psych:  She has a normal mood and affect  Assessment:   Healthy well-woman exam IUD  check Hypertension   Plan:  To start BP meds F/U 1 year for AE, or sooner if needed Mammogram ordered or sooner if problems   Dshaun Reppucci Rockney Ghee, CNM

## 2016-10-31 ENCOUNTER — Encounter: Payer: Self-pay | Admitting: Obstetrics and Gynecology

## 2016-10-31 LAB — COMPREHENSIVE METABOLIC PANEL
ALT: 14 IU/L (ref 0–32)
AST: 17 IU/L (ref 0–40)
Albumin/Globulin Ratio: 2 (ref 1.2–2.2)
Albumin: 4.9 g/dL (ref 3.5–5.5)
Alkaline Phosphatase: 50 IU/L (ref 39–117)
BUN/Creatinine Ratio: 16 (ref 9–23)
BUN: 13 mg/dL (ref 6–24)
Bilirubin Total: 0.4 mg/dL (ref 0.0–1.2)
CALCIUM: 9.3 mg/dL (ref 8.7–10.2)
CO2: 26 mmol/L (ref 20–29)
CREATININE: 0.79 mg/dL (ref 0.57–1.00)
Chloride: 100 mmol/L (ref 96–106)
GFR calc Af Amer: 106 mL/min/{1.73_m2} (ref >59)
GFR, EST NON AFRICAN AMERICAN: 92 mL/min/{1.73_m2} (ref >59)
GLOBULIN, TOTAL: 2.4 g/dL (ref 1.5–4.5)
Glucose: 88 mg/dL (ref 65–99)
Potassium: 4.8 mmol/L (ref 3.5–5.2)
SODIUM: 141 mmol/L (ref 134–144)
Total Protein: 7.3 g/dL (ref 6.0–8.5)

## 2016-10-31 LAB — FOLLICLE STIMULATING HORMONE: FSH: 5.7 m[IU]/mL

## 2016-10-31 LAB — HEMOGLOBIN A1C
ESTIMATED AVERAGE GLUCOSE: 105 mg/dL
Hgb A1c MFr Bld: 5.3 % (ref 4.8–5.6)

## 2016-10-31 LAB — LIPID PANEL
CHOLESTEROL TOTAL: 204 mg/dL — AB (ref 100–199)
Chol/HDL Ratio: 2.6 ratio (ref 0.0–4.4)
HDL: 79 mg/dL (ref >39)
LDL CALC: 115 mg/dL — AB (ref 0–99)
Triglycerides: 52 mg/dL (ref 0–149)
VLDL Cholesterol Cal: 10 mg/dL (ref 5–40)

## 2016-10-31 LAB — THYROID PANEL WITH TSH
Free Thyroxine Index: 2 (ref 1.2–4.9)
T3 UPTAKE RATIO: 25 % (ref 24–39)
T4 TOTAL: 7.9 ug/dL (ref 4.5–12.0)
TSH: 0.941 u[IU]/mL (ref 0.450–4.500)

## 2016-12-17 ENCOUNTER — Encounter: Payer: Self-pay | Admitting: Obstetrics and Gynecology

## 2016-12-18 ENCOUNTER — Encounter: Payer: Self-pay | Admitting: Obstetrics and Gynecology

## 2016-12-18 ENCOUNTER — Other Ambulatory Visit: Payer: Self-pay | Admitting: Obstetrics and Gynecology

## 2016-12-18 DIAGNOSIS — R928 Other abnormal and inconclusive findings on diagnostic imaging of breast: Secondary | ICD-10-CM

## 2016-12-19 ENCOUNTER — Encounter: Payer: Self-pay | Admitting: Obstetrics and Gynecology

## 2016-12-20 ENCOUNTER — Encounter: Payer: Self-pay | Admitting: Obstetrics and Gynecology

## 2017-01-01 ENCOUNTER — Encounter: Payer: Self-pay | Admitting: Obstetrics and Gynecology

## 2017-01-02 ENCOUNTER — Telehealth: Payer: Self-pay | Admitting: Obstetrics and Gynecology

## 2017-01-02 NOTE — Telephone Encounter (Signed)
Patient needs a diagnostic Bilateral for pain in both sides of breast order faxed  She is scheduled for 03/18/2017 for screening but patient would like sooner appointment due to pain   Fax [phone]

## 2017-01-04 NOTE — Telephone Encounter (Signed)
Faxed to [employer]

## 2017-01-29 ENCOUNTER — Encounter: Payer: Self-pay | Admitting: Emergency Medicine

## 2017-01-29 ENCOUNTER — Emergency Department: Payer: BLUE CROSS/BLUE SHIELD

## 2017-01-29 ENCOUNTER — Emergency Department
Admission: EM | Admit: 2017-01-29 | Discharge: 2017-01-29 | Disposition: A | Discharge status: Home / Self Care | Payer: BLUE CROSS/BLUE SHIELD | Attending: Emergency Medicine | Admitting: Emergency Medicine

## 2017-01-29 DIAGNOSIS — Z79899 Other long term (current) drug therapy: Secondary | ICD-10-CM | POA: Insufficient documentation

## 2017-01-29 DIAGNOSIS — R079 Chest pain, unspecified: Secondary | ICD-10-CM | POA: Insufficient documentation

## 2017-01-29 LAB — BASIC METABOLIC PANEL
Anion gap: 6 (ref 5–15)
BUN: 11 mg/dL (ref 6–20)
CALCIUM: 8.7 mg/dL — AB (ref 8.9–10.3)
CO2: 29 mmol/L (ref 22–32)
CREATININE: 0.66 mg/dL (ref 0.44–1.00)
Chloride: 103 mmol/L (ref 101–111)
GFR calc Af Amer: >60 mL/min (ref >60)
GLUCOSE: 92 mg/dL (ref 65–99)
Potassium: 4 mmol/L (ref 3.5–5.1)
SODIUM: 138 mmol/L (ref 135–145)

## 2017-01-29 LAB — CBC
HCT: 37.9 % (ref 35.0–47.0)
Hemoglobin: 13.1 g/dL (ref 12.0–16.0)
MCH: 31.6 pg (ref 26.0–34.0)
MCHC: 34.5 g/dL (ref 32.0–36.0)
MCV: 91.7 fL (ref 80.0–100.0)
Platelets: 269 10*3/uL (ref 150–440)
RBC: 4.13 MIL/uL (ref 3.80–5.20)
RDW: 13.4 % (ref 11.5–14.5)
WBC: 7.4 10*3/uL (ref 3.6–11.0)

## 2017-01-29 LAB — TROPONIN I

## 2017-01-29 MED ORDER — GI COCKTAIL ~~LOC~~
30.0000 mL | Freq: Once | ORAL | Status: DC
  Filled 2017-01-29: qty 30

## 2017-01-29 NOTE — ED Provider Notes (Signed)
[facility] Emergency Department Provider Note  ____________________________________________   First MD Initiated Contact with Patient 01/29/17 1446     (approximate)  I have reviewed the triage vital signs and the nursing notes.   HISTORY  Chief Complaint Chest Pain   HPI Julie Skinner is a 41 y.o. female with a history of myasthenia gravis as well as high blood pressure was presenting to the emergency department with left-sided chest pain as well as left-sided back pain. Says that this is all sorted around 11 AM this morning at work after she completed a conference call. Says that she felt nauseous at the same time as the symptoms began but did not vomit. Denies any shortness of breath or worsening pain with deep breathing. Says the pain is "less than a 5." Feels like a "heaviness." Denies any history of heart disease. Denies any history of heart disease in her family. Does not smoke or use any drugs.  says the pain is been constant. She says that she has had reflux in the past which has been similar to this pain. However, she says that she "wanted to get it checked out today." She initially went to urgent care and was sent to the emergency department for further evaluation and treatment.   Past Medical History:  Diagnosis Date  . Left breast lump   . Myasthenia gravis ([place]) 2007  . Myasthenia gravis ([place])   . Vaginal Pap smear, abnormal     Patient Active Problem List   Diagnosis Date Noted  . Myasthenia gravis ([place]) 10/27/2015  . Overweight (BMI 25.0-29.9) 10/27/2015  . 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 BREAST SURGERY Left 1999   lump removed  . CESAREAN SECTION  [id number]  . THYMECTOMY  2007  . thymetomy  2007    Prior to Admission medications   Medication Sig Start Date End Date Taking? Authorizing Provider  cholecalciferol (VITAMIN D) 1000 UNITS tablet Take 1,000 Units by mouth  as needed. Reported on 05/04/2015    [provider]  cyanocobalamin (,VITAMIN B-12,) 1000 MCG/ML injection Inject into the muscle. 11/13/13   [provider]  gabapentin (NEURONTIN) 600 MG tablet Take 2 tablets at bedtime 06/05/16   [provider]  levonorgestrel (MIRENA) 20 MCG/24HR IUD 1 each by Intrauterine route once.    [provider]  losartan (COZAAR) 50 MG tablet Take 1 tablet (50 mg total) by mouth daily. 10/30/16   Shambley, Melody N, CNM  phentermine (ADIPEX-P) 37.5 MG tablet Take 1 tablet (37.5 mg total) by mouth daily before breakfast. Patient not taking: Reported on 10/30/2016 06/14/16   Joylene Igo, CNM  traMADol (ULTRAM) 50 MG tablet Take 50 mg by mouth every 6 (six) hours as needed. Reported on 09/01/2015    [provider]    Allergies Patient has no known allergies.  Family History  Problem Relation Age of Onset  . Heart disease Mother   . Hypertension Mother   . Heart disease Father   . Hypertension Father   . Heart disease Maternal Grandmother   . Cancer Maternal Grandfather        breast    Social History Social History  Substance Use Topics  . Smoking status: Never Smoker  . Smokeless tobacco: Never Used  . Alcohol use Yes     Comment: occas    Review of Systems  Constitutional: No fever/chills Eyes: No visual changes. ENT:  No sore throat. Cardiovascular:as above Respiratory: Denies shortness of breath. Gastrointestinal: No abdominal pain.  No nausea, no vomiting.  No diarrhea.  No constipation. Genitourinary: Negative for dysuria. Musculoskeletal: as above Skin: Negative for rash. Neurological: Negative for headaches, focal weakness or numbness.   ____________________________________________   PHYSICAL EXAM:  VITAL SIGNS: ED Triage Vitals  Enc Vitals Group     BP 01/29/17 1322 123/68     Pulse Rate 01/29/17 1322 68     Resp 01/29/17 1322 20     Temp 01/29/17 1322 98.2 F (36.8 C)      Temp Source 01/29/17 1322 Oral     SpO2 01/29/17 1322 100 %     Weight 01/29/17 1322 150 lb (68 kg)     Height 01/29/17 1322 5\' 4"  (1.626 m)     Head Circumference --      Peak Flow --      Pain Score 01/29/17 1329 5     Pain Loc --      Pain Edu? --      Excl. in [place]? --     Constitutional: Alert and oriented. Well appearing and in no acute distress. Eyes: Conjunctivae are normal.  Head: Atraumatic. Nose: No congestion/rhinnorhea. Mouth/Throat: Mucous membranes are moist.  Neck: No stridor.   Cardiovascular: Normal rate, regular rhythm. Grossly normal heart sounds.  Good peripheral circulation with equal and bilateral radial as well as dorsalis pedis pulses. Chest pain is nonreproducible palpation, nor is the back pain. Respiratory: Normal respiratory effort.  No retractions. Lungs CTAB. Gastrointestinal: Soft and nontender. No distention.  Musculoskeletal: No lower extremity tenderness nor edema.  No joint effusions. Neurologic:  Normal speech and language. No gross focal neurologic deficits are appreciated. Skin:  Skin is warm, dry and intact. No rash noted. Psychiatric: Mood and affect are normal. Speech and behavior are normal.  ____________________________________________   LABS (all labs ordered are listed, but only abnormal results are displayed)  Labs Reviewed  BASIC METABOLIC PANEL - Abnormal; Notable for the following:       Result Value   Calcium 8.7 (*)    All other components within normal limits  CBC  TROPONIN I   ____________________________________________  EKG  ED ECG REPORT I, [place],  Youlanda Roys, the attending physician, personally viewed and interpreted this ECG.   Date: 01/29/2017  EKG Time: 1319  Rate: 73  Rhythm: normal sinus rhythm  Axis: normal  Intervals:none  ST&T Change: no ST segment elevation or depression. No abnormal T-wave inversion.  ____________________________________________  RADIOLOGY  no active cardiopulmonary  disease. ____________________________________________   PROCEDURES  Procedure(s) performed:   Procedures  Critical Care performed:   ____________________________________________   INITIAL IMPRESSION / ASSESSMENT AND PLAN / ED COURSE  Pertinent labs & imaging results that were available during my care of the patient were reviewed by me and considered in my medical decision making (see chart for details). DDX: PE, ACS, aortic dissection, pneumothorax, GERD, nausea PERC neg.  Heart score of 1.    patient offered a GI cocktail to see if this relieves her pain. However, she is concerned that this may react with her myasthenia and so does not want it even though it does not appear that any of the medications in the GI cocktail on the contrary dictation list for myasthenia patients. Patient seems to be having very atypical chest pain with a very reassuring exam. Very reassuring lab work as well as EKG. She'll be given follow-up information for cardiology. We  discussed return precautions especially, increased chest pain, and she will return for any worsening or concerning symptoms. She will try over-the-counter acid reducers. She is understanding the plan and willing to comply.      ____________________________________________   FINAL CLINICAL IMPRESSION(S) / ED DIAGNOSES   chest pain.   NEW MEDICATIONS STARTED DURING THIS VISIT:  New Prescriptions   No medications on file     Note:  This document was prepared using Dragon voice recognition software and may include unintentional dictation errors.     Orbie Pyo, MD 01/29/17 [phone]

## 2017-01-29 NOTE — ED Triage Notes (Signed)
Pt c/o left sided chest heaviness that has been constant starting today. Not made worse by anything. No other associated sx other than nausea. Currently unlabored, NAD, VSS.

## 2017-01-29 NOTE — ED Notes (Signed)
ED Provider at bedside. 

## 2017-02-18 ENCOUNTER — Encounter: Payer: Self-pay | Admitting: Obstetrics and Gynecology

## 2017-02-19 ENCOUNTER — Other Ambulatory Visit: Payer: Self-pay | Admitting: Obstetrics and Gynecology

## 2017-02-19 ENCOUNTER — Encounter: Payer: Self-pay | Admitting: Obstetrics and Gynecology

## 2017-02-19 MED ORDER — ESCITALOPRAM OXALATE 10 MG PO TABS: 10.0000 mg | ORAL_TABLET | Freq: Every day | ORAL | 6 refills | Status: DC

## 2017-03-21 ENCOUNTER — Encounter: Payer: Self-pay | Admitting: Obstetrics and Gynecology

## 2017-04-27 ENCOUNTER — Encounter: Payer: Self-pay | Admitting: Obstetrics and Gynecology

## 2017-05-06 ENCOUNTER — Other Ambulatory Visit: Payer: BLUE CROSS/BLUE SHIELD

## 2017-05-06 DIAGNOSIS — E663 Overweight: Secondary | ICD-10-CM

## 2017-05-06 DIAGNOSIS — E559 Vitamin D deficiency, unspecified: Secondary | ICD-10-CM

## 2017-05-06 DIAGNOSIS — R5383 Other fatigue: Secondary | ICD-10-CM

## 2017-05-12 LAB — CBC
HEMATOCRIT: 40.8 % (ref 34.0–46.6)
Hemoglobin: 13.8 g/dL (ref 11.1–15.9)
MCH: 31.2 pg (ref 26.6–33.0)
MCHC: 33.8 g/dL (ref 31.5–35.7)
MCV: 92 fL (ref 79–97)
PLATELETS: 251 10*3/uL (ref 150–379)
RBC: 4.42 x10E6/uL (ref 3.77–5.28)
RDW: 13 % (ref 12.3–15.4)
WBC: 5.4 10*3/uL (ref 3.4–10.8)

## 2017-05-12 LAB — VITAMIN D 1,25 DIHYDROXY
VITAMIN D3 1, 25 (OH): 36 pg/mL
Vitamin D 1, 25 (OH)2 Total: 37 pg/mL
Vitamin D2 1, 25 (OH)2: <10 pg/mL

## 2017-05-12 LAB — HEMOGLOBIN A1C
ESTIMATED AVERAGE GLUCOSE: 114 mg/dL
HEMOGLOBIN A1C: 5.6 % (ref 4.8–5.6)

## 2017-05-12 LAB — TSH: TSH: 0.884 u[IU]/mL (ref 0.450–4.500)

## 2017-05-12 LAB — FERRITIN: FERRITIN: 63 ng/mL (ref 15–150)

## 2017-05-12 LAB — VITAMIN B12: Vitamin B-12: 422 pg/mL (ref 232–1245)

## 2017-06-08 ENCOUNTER — Other Ambulatory Visit: Payer: Self-pay | Admitting: Obstetrics and Gynecology

## 2017-06-24 ENCOUNTER — Encounter: Payer: Self-pay | Admitting: Obstetrics and Gynecology

## 2017-06-24 ENCOUNTER — Ambulatory Visit (INDEPENDENT_AMBULATORY_CARE_PROVIDER_SITE_OTHER): Payer: BLUE CROSS/BLUE SHIELD | Admitting: Certified Nurse Midwife

## 2017-06-24 VITALS — BP 121/81 | HR 97 | Wt 159.7 lb

## 2017-06-24 DIAGNOSIS — E663 Overweight: Secondary | ICD-10-CM | POA: Diagnosis not present

## 2017-06-24 MED ORDER — CYANOCOBALAMIN 1000 MCG/ML IJ SOLN
1000.0000 ug | Freq: Once | INTRAMUSCULAR | Status: AC
  Administered 2017-06-24: 1000 ug via INTRAMUSCULAR

## 2017-06-24 NOTE — Progress Notes (Signed)
Pt is here for wt, bp check, b-12 inj She is doing well, is ready to start exercising   06/24/17 wt- 159.7lb

## 2017-07-04 DIAGNOSIS — Z86018 Personal history of other benign neoplasm: Secondary | ICD-10-CM

## 2017-07-04 HISTORY — DX: Personal history of other benign neoplasm: Z86.018

## 2017-07-22 ENCOUNTER — Ambulatory Visit (INDEPENDENT_AMBULATORY_CARE_PROVIDER_SITE_OTHER): Payer: BLUE CROSS/BLUE SHIELD | Admitting: Obstetrics and Gynecology

## 2017-07-22 VITALS — BP 121/78 | HR 91 | Wt 160.0 lb

## 2017-07-22 DIAGNOSIS — E663 Overweight: Secondary | ICD-10-CM

## 2017-07-22 MED ORDER — CYANOCOBALAMIN 1000 MCG/ML IJ SOLN
1000.0000 ug | Freq: Once | INTRAMUSCULAR | Status: AC
  Administered 2017-07-22: 1000 ug via INTRAMUSCULAR

## 2017-07-22 NOTE — Progress Notes (Signed)
Pt is here for wt, bp check, b-12 inj She is doing well  07/22/17 wt- 160lb 06/24/17 159lb

## 2017-08-19 ENCOUNTER — Ambulatory Visit (INDEPENDENT_AMBULATORY_CARE_PROVIDER_SITE_OTHER): Payer: BLUE CROSS/BLUE SHIELD | Admitting: Obstetrics and Gynecology

## 2017-08-19 VITALS — BP 125/83 | HR 91 | Ht 64.0 in | Wt 158.6 lb

## 2017-08-19 DIAGNOSIS — Z79899 Other long term (current) drug therapy: Secondary | ICD-10-CM | POA: Diagnosis not present

## 2017-08-19 DIAGNOSIS — E663 Overweight: Secondary | ICD-10-CM

## 2017-08-19 MED ORDER — CYANOCOBALAMIN 1000 MCG/ML IJ SOLN
1000.0000 ug | Freq: Once | INTRAMUSCULAR | Status: AC
  Administered 2017-08-19: 1000 ug via INTRAMUSCULAR

## 2017-08-19 NOTE — Progress Notes (Signed)
Pt presents for wt, bp and b12 inj. Last visit wt- 160. Down 2#. NO s/e from adipex. Pt is exercising 3 x a week for 1 hour. B12 supplied. Pt to f/u with [facility] 4 weeks.   BP 125/83   Pulse 91   Ht 5\' 4"  (1.626 m)   Wt 158 lb 9.6 oz (71.9 kg)   LMP  (LMP Unknown)   BMI 27.22 kg/m

## 2017-09-17 ENCOUNTER — Ambulatory Visit: Payer: BLUE CROSS/BLUE SHIELD | Admitting: Obstetrics and Gynecology

## 2017-09-17 ENCOUNTER — Encounter: Payer: Self-pay | Admitting: Obstetrics and Gynecology

## 2017-09-17 VITALS — BP 120/79 | HR 94 | Wt 152.0 lb

## 2017-09-17 DIAGNOSIS — E663 Overweight: Secondary | ICD-10-CM

## 2017-09-17 MED ORDER — CYANOCOBALAMIN 1000 MCG/ML IJ SOLN: 1000.0000 ug | INTRAMUSCULAR | 10 refills | Status: DC

## 2017-09-17 NOTE — Progress Notes (Signed)
SUBJECTIVE:  42 y.o. here for follow-up weight loss visit, previously seen 4 weeks ago. Denies any concerns and feels like medication is still working well. Has lost 10#s in last month.  Is jogging daily and getting ready for a 5K. Has increase green leafy foods and shakes daily.   OBJECTIVE:  BP 120/79   Pulse 94   Wt 152 lb (68.9 kg)   LMP  (LMP Unknown)   BMI 26.09 kg/m   Body mass index is 26.09 kg/m. Patient appears well. ASSESSMENT:  Overweight- responding well to weight loss plan PLAN:  To continue with current medications. B12 1027mcg/ml injection given RTC in 4 weeks as planned  Melody Elmira, CNM

## 2017-10-21 ENCOUNTER — Encounter: Payer: Self-pay | Admitting: Obstetrics and Gynecology

## 2017-10-30 ENCOUNTER — Encounter: Payer: Self-pay | Admitting: Obstetrics and Gynecology

## 2017-11-01 ENCOUNTER — Encounter: Payer: BLUE CROSS/BLUE SHIELD | Admitting: Obstetrics and Gynecology

## 2017-11-04 ENCOUNTER — Ambulatory Visit: Payer: BLUE CROSS/BLUE SHIELD | Admitting: Obstetrics and Gynecology

## 2017-11-20 ENCOUNTER — Encounter: Payer: Self-pay | Admitting: Obstetrics and Gynecology

## 2017-11-20 ENCOUNTER — Ambulatory Visit (INDEPENDENT_AMBULATORY_CARE_PROVIDER_SITE_OTHER): Payer: BLUE CROSS/BLUE SHIELD | Admitting: Obstetrics and Gynecology

## 2017-11-20 VITALS — BP 111/83 | HR 109 | Ht 64.0 in | Wt 151.5 lb

## 2017-11-20 DIAGNOSIS — Z01419 Encounter for gynecological examination (general) (routine) without abnormal findings: Secondary | ICD-10-CM | POA: Diagnosis not present

## 2017-11-20 MED ORDER — CYANOCOBALAMIN 1000 MCG/ML IJ SOLN: 1000.0000 ug | INTRAMUSCULAR | 10 refills | Status: DC

## 2017-11-20 MED ORDER — LOSARTAN POTASSIUM 50 MG PO TABS: ORAL_TABLET | ORAL | 6 refills | Status: DC

## 2017-11-20 NOTE — Progress Notes (Signed)
Subjective:   Julie Skinner is a 42 y.o. G21P2 Caucasian female here for a routine well-woman exam.  No LMP recorded. (Menstrual status: IUD).    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  Past Medical History Past Medical History:  Diagnosis Date  . Left breast lump   . Myasthenia gravis ([place]) 2007  . Myasthenia gravis ([place])   . Vaginal Pap smear, abnormal     Past Surgical History Past Surgical History:  Procedure Laterality Date  . BREAST SURGERY Left 1999   lump removed  . CESAREAN SECTION  [id number]  . THYMECTOMY  2007  . thymetomy  2007    Gynecologic History G2P2  No LMP recorded. (Menstrual status: IUD). Contraception: IUD Last Pap: 10/2014. Results were: normal Last mammogram: 01/2017. Results were: normal  Obstetric History OB History  Gravida Para Term Preterm AB Living  2 2       2   SAB TAB Ectopic Multiple Live Births               # Outcome Date GA Lbr Len/2nd Weight Sex Delivery Anes PTL Lv  2 Para           1 Para             Obstetric Comments  1st Menstrual Cycle: 10  1st Pregnancy:  29    Current Medications Current Outpatient Medications on File Prior to Visit  Medication Sig Dispense Refill  . cholecalciferol (VITAMIN D) 1000 UNITS tablet Take 1,000 Units by mouth as needed. Reported on 05/04/2015    . cyanocobalamin (,VITAMIN B-12,) 1000 MCG/ML injection Inject 1 mL (1,000 mcg total) into the muscle every 30 (thirty) days. 1 mL 10  . levonorgestrel  (MIRENA) 20 MCG/24HR IUD 1 each by Intrauterine route once.    Marland Kitchen losartan (COZAAR) 50 MG tablet TAKE 1 TABLET(50 MG) BY MOUTH DAILY 30 tablet 6  . phentermine (ADIPEX-P) 37.5 MG tablet Take 1 tablet (37.5 mg total) by mouth daily before breakfast. 30 tablet 2  . Biotin 1000 MCG tablet Take 1,000 mcg by mouth 3 (three) times daily.    Marland Kitchen gabapentin (NEURONTIN) 600 MG tablet Take 2 tablets at bedtime    . traMADol (ULTRAM) 50 MG tablet Take 50 mg by mouth every 6 (six) hours as needed. Reported on 09/01/2015     Current Facility-Administered Medications on File Prior to Visit  Medication Dose Route Frequency Provider Last Rate Last Dose  . cyanocobalamin ((VITAMIN B-12)) injection 1,000 mcg  1,000 mcg Intramuscular Once [facility], Melody N, CNM        Review of Systems Patient denies any headaches, blurred vision, shortness of breath, chest pain, abdominal pain, problems with bowel movements, urination, or intercourse.  Objective:  BP 111/83   Pulse (!) 109   Ht 5\' 4"  (1.626 m)   Wt 151 lb 8 oz (68.7 kg)   BMI 26.00 kg/m  Physical Exam  General:  Well developed, well nourished, no acute distress. She is alert and oriented x3. Skin:  Warm and dry Neck:  Midline trachea, no  thyromegaly or nodules Cardiovascular: Regular rate and rhythm, no murmur heard Lungs:  Effort normal, all lung fields clear to auscultation bilaterally Breasts:  No dominant palpable mass, retraction, or nipple discharge Abdomen:  Soft, non tender, no hepatosplenomegaly or masses Pelvic:  External genitalia is normal in appearance.  The vagina is normal in appearance. The cervix is bulbous, no CMT. IUD string noted.  Thin prep pap is not done. Uterus is felt to be normal size, shape, and contour.  No adnexal masses or tenderness noted. Extremities:  No swelling or varicosities noted Psych:  She has a normal mood and affect  Assessment:   Healthy well-woman exam  Plan:   F/U 1 year for AE, or sooner if  needed Mammogram ordered  Melody Rockney Ghee, CNM

## 2017-11-20 NOTE — Patient Instructions (Signed)
Preventive Care 18-39 Years, Female Preventive care refers to lifestyle choices and visits with your health care provider that can promote health and wellness. What does preventive care include?  A yearly physical exam. This is also called an annual well check.  Dental exams once or twice a year.  Routine eye exams. Ask your health care provider how often you should have your eyes checked.  Personal lifestyle choices, including: ? Daily care of your teeth and gums. ? Regular physical activity. ? Eating a healthy diet. ? Avoiding tobacco and drug use. ? Limiting alcohol use. ? Practicing safe sex. ? Taking vitamin and mineral supplements as recommended by your health care provider. What happens during an annual well check? The services and screenings done by your health care provider during your annual well check will depend on your age, overall health, lifestyle risk factors, and family history of disease. Counseling Your health care provider may ask you questions about your:  Alcohol use.  Tobacco use.  Drug use.  Emotional well-being.  Home and relationship well-being.  Sexual activity.  Eating habits.  Work and work Statistician.  Method of birth control.  Menstrual cycle.  Pregnancy history.  Screening You may have the following tests or measurements:  Height, weight, and BMI.  Diabetes screening. This is done by checking your blood sugar (glucose) after you have not eaten for a while (fasting).  Blood pressure.  Lipid and cholesterol levels. These may be checked every 5 years starting at age 38.  Skin check.  Hepatitis C blood test.  Hepatitis B blood test.  Sexually transmitted disease (STD) testing.  BRCA-related cancer screening. This may be done if you have a family history of breast, ovarian, tubal, or peritoneal cancers.  Pelvic exam and Pap test. This may be done every 3 years starting at age 38. Starting at age 30, this may be done  every 5 years if you have a Pap test in combination with an HPV test.  Discuss your test results, treatment options, and if necessary, the need for more tests with your health care provider. Vaccines Your health care provider may recommend certain vaccines, such as:  Influenza vaccine. This is recommended every year.  Tetanus, diphtheria, and acellular pertussis (Tdap, Td) vaccine. You may need a Td booster every 10 years.  Varicella vaccine. You may need this if you have not been vaccinated.  HPV vaccine. If you are 39 or younger, you may need three doses over 6 months.  Measles, mumps, and rubella (MMR) vaccine. You may need at least one dose of MMR. You may also need a second dose.  Pneumococcal 13-valent conjugate (PCV13) vaccine. You may need this if you have certain conditions and were not previously vaccinated.  Pneumococcal polysaccharide (PPSV23) vaccine. You may need one or two doses if you smoke cigarettes or if you have certain conditions.  Meningococcal vaccine. One dose is recommended if you are age 68-21 years and a first-year college student living in a residence hall, or if you have one of several medical conditions. You may also need additional booster doses.  Hepatitis A vaccine. You may need this if you have certain conditions or if you travel or work in places where you may be exposed to hepatitis A.  Hepatitis B vaccine. You may need this if you have certain conditions or if you travel or work in places where you may be exposed to hepatitis B.  Haemophilus influenzae type b (Hib) vaccine. You may need this  if you have certain risk factors.  Talk to your health care provider about which screenings and vaccines you need and how often you need them. This information is not intended to replace advice given to you by your health care provider. Make sure you discuss any questions you have with your health care provider. Document Released: 06/19/2001 Document Revised:  01/11/2016 Document Reviewed: 02/22/2015 Elsevier Interactive Patient Education  2018 Elsevier Inc.  

## 2017-11-21 LAB — LIPID PANEL
Chol/HDL Ratio: 2.1 ratio (ref 0.0–4.4)
Cholesterol, Total: 201 mg/dL — ABNORMAL HIGH (ref 100–199)
HDL: 95 mg/dL (ref >39)
LDL CALC: 97 mg/dL (ref 0–99)
TRIGLYCERIDES: 46 mg/dL (ref 0–149)
VLDL Cholesterol Cal: 9 mg/dL (ref 5–40)

## 2017-11-21 LAB — COMPREHENSIVE METABOLIC PANEL
A/G RATIO: 2.1 (ref 1.2–2.2)
ALT: 14 IU/L (ref 0–32)
AST: 18 IU/L (ref 0–40)
Albumin: 4.7 g/dL (ref 3.5–5.5)
Alkaline Phosphatase: 49 IU/L (ref 39–117)
BUN/Creatinine Ratio: 14 (ref 9–23)
BUN: 10 mg/dL (ref 6–24)
Bilirubin Total: 0.5 mg/dL (ref 0.0–1.2)
CALCIUM: 9.1 mg/dL (ref 8.7–10.2)
CHLORIDE: 98 mmol/L (ref 96–106)
CO2: 25 mmol/L (ref 20–29)
CREATININE: 0.71 mg/dL (ref 0.57–1.00)
GFR calc Af Amer: 120 mL/min/{1.73_m2} (ref >59)
GFR calc non Af Amer: 104 mL/min/{1.73_m2} (ref >59)
GLUCOSE: 87 mg/dL (ref 65–99)
Globulin, Total: 2.2 g/dL (ref 1.5–4.5)
Potassium: 4.6 mmol/L (ref 3.5–5.2)
SODIUM: 139 mmol/L (ref 134–144)
TOTAL PROTEIN: 6.9 g/dL (ref 6.0–8.5)

## 2017-12-20 ENCOUNTER — Encounter: Payer: BLUE CROSS/BLUE SHIELD | Admitting: Obstetrics and Gynecology

## 2017-12-31 ENCOUNTER — Ambulatory Visit (INDEPENDENT_AMBULATORY_CARE_PROVIDER_SITE_OTHER): Payer: BLUE CROSS/BLUE SHIELD | Admitting: Obstetrics and Gynecology

## 2017-12-31 ENCOUNTER — Encounter: Payer: Self-pay | Admitting: Obstetrics and Gynecology

## 2017-12-31 VITALS — BP 126/84 | HR 105 | Ht 64.0 in | Wt 147.6 lb

## 2017-12-31 DIAGNOSIS — E663 Overweight: Secondary | ICD-10-CM

## 2017-12-31 MED ORDER — CYANOCOBALAMIN 1000 MCG/ML IJ SOLN
1000.0000 ug | Freq: Once | INTRAMUSCULAR | Status: AC
  Administered 2017-12-31: 1000 ug via INTRAMUSCULAR

## 2017-12-31 NOTE — Progress Notes (Signed)
Pt is here for wt, bp check, b-12 inj She is doing well, denies any s/e  12/31/17 wt- 147.6lb 11/20/17 151lb

## 2018-01-11 ENCOUNTER — Other Ambulatory Visit: Payer: Self-pay | Admitting: Obstetrics and Gynecology

## 2018-01-11 DIAGNOSIS — E663 Overweight: Secondary | ICD-10-CM

## 2018-01-12 ENCOUNTER — Other Ambulatory Visit: Payer: Self-pay | Admitting: Obstetrics and Gynecology

## 2018-01-27 ENCOUNTER — Encounter: Payer: Self-pay | Admitting: Obstetrics and Gynecology

## 2018-01-27 ENCOUNTER — Ambulatory Visit: Payer: BLUE CROSS/BLUE SHIELD

## 2018-01-27 ENCOUNTER — Ambulatory Visit (INDEPENDENT_AMBULATORY_CARE_PROVIDER_SITE_OTHER): Payer: BLUE CROSS/BLUE SHIELD | Admitting: Obstetrics and Gynecology

## 2018-01-27 ENCOUNTER — Other Ambulatory Visit: Payer: Self-pay | Admitting: Obstetrics and Gynecology

## 2018-01-27 ENCOUNTER — Ambulatory Visit
Admission: RE | Admit: 2018-01-27 | Discharge: 2018-01-27 | Disposition: A | Discharge status: Home / Self Care | Payer: BLUE CROSS/BLUE SHIELD | Source: Ambulatory Visit | Attending: Obstetrics and Gynecology | Admitting: Obstetrics and Gynecology

## 2018-01-27 VITALS — BP 120/85 | HR 94 | Ht 64.0 in | Wt 155.6 lb

## 2018-01-27 DIAGNOSIS — E663 Overweight: Secondary | ICD-10-CM

## 2018-01-27 DIAGNOSIS — Z1231 Encounter for screening mammogram for malignant neoplasm of breast: Secondary | ICD-10-CM

## 2018-01-27 MED ORDER — CYANOCOBALAMIN 1000 MCG/ML IJ SOLN
1000.0000 ug | Freq: Once | INTRAMUSCULAR | Status: AC
  Administered 2018-01-27: 1000 ug via INTRAMUSCULAR

## 2018-01-27 NOTE — Progress Notes (Signed)
Pt is here for wt, bp check, b-12 inj She is doing well, denies any s/e  01/27/18 wt- 155.6lb 12/31/17 wt- 147lb  Waist : 33 in

## 2018-02-24 ENCOUNTER — Ambulatory Visit: Payer: BLUE CROSS/BLUE SHIELD | Admitting: Obstetrics and Gynecology

## 2018-02-26 ENCOUNTER — Ambulatory Visit (INDEPENDENT_AMBULATORY_CARE_PROVIDER_SITE_OTHER): Payer: BLUE CROSS/BLUE SHIELD | Admitting: Obstetrics and Gynecology

## 2018-02-26 ENCOUNTER — Encounter: Payer: Self-pay | Admitting: Obstetrics and Gynecology

## 2018-02-26 VITALS — BP 113/81 | HR 104 | Ht 64.0 in | Wt 156.1 lb

## 2018-02-26 DIAGNOSIS — E663 Overweight: Secondary | ICD-10-CM | POA: Diagnosis not present

## 2018-02-26 DIAGNOSIS — Z79899 Other long term (current) drug therapy: Secondary | ICD-10-CM

## 2018-02-26 MED ORDER — CYANOCOBALAMIN 1000 MCG/ML IJ SOLN
1000.0000 ug | Freq: Once | INTRAMUSCULAR | Status: AC
Start: 2018-02-26 — End: ?

## 2018-02-26 MED ORDER — PHENDIMETRAZINE TARTRATE ER 105 MG PO CP24: 1.0000 | ORAL_CAPSULE | Freq: Every day | ORAL | 1 refills | Status: DC

## 2018-02-26 MED ORDER — CYANOCOBALAMIN 1000 MCG/ML IJ SOLN: 1000.0000 ug | INTRAMUSCULAR | 10 refills | Status: DC

## 2018-02-26 NOTE — Progress Notes (Signed)
Pt is here for wt, bp check, b-12 inj She is doing well, denies any s/e   02/26/18 wt- 156.1lb 01/27/18 wt- 147lb   Waist : 32.5 in  States she is still exercising and watching what she eats with weight gain. Cannot run anymore due to knee issues and surgery last year. Is walking. frustrated over weight and desires continued active management. 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   Counseled on trying bontril ER 105mg  and switching to spin classes/pilates/yoga/water aerobics. Is willing to try. B12 injection given.  RTC 4 weeks    Melody Shambley,CNM

## 2018-03-25 ENCOUNTER — Encounter: Payer: BLUE CROSS/BLUE SHIELD | Admitting: Obstetrics and Gynecology

## 2018-04-21 ENCOUNTER — Encounter: Payer: BLUE CROSS/BLUE SHIELD | Admitting: Obstetrics and Gynecology

## 2018-10-28 ENCOUNTER — Telehealth: Payer: Self-pay

## 2018-10-28 NOTE — Telephone Encounter (Signed)
Coronavirus (COVID-19) Are you at risk?  Are you at risk for the Coronavirus (COVID-19)?  To be considered HIGH RISK for Coronavirus (COVID-19), you have to meet the following criteria:  . Traveled to [place], [place], [place], [place] or [place]; or in the [place] to [place], [place], [place], or [place]; and have fever, cough, and shortness of breath within the last 2 weeks of travel OR . Been in close contact with a person diagnosed with COVID-19 within the last 2 weeks and have fever, cough, and shortness of breath . IF YOU DO NOT MEET THESE CRITERIA, YOU ARE CONSIDERED LOW RISK FOR COVID-19.  What to do if you are HIGH RISK for COVID-19?  . If you are having a medical emergency, call 911. . Seek medical care right away. Before you go to a doctor's office, urgent care or emergency department, call ahead and tell them about your recent travel, contact with someone diagnosed with COVID-19, and your symptoms. You should receive instructions from your physician's office regarding next steps of care.  . When you arrive at healthcare provider, tell the healthcare staff immediately you have returned from visiting [place], [place], [place], [place] or [place]; or traveled in the [place] to [place], [place], [place], or [place]; in the last two weeks or you have been in close contact with a person diagnosed with COVID-19 in the last 2 weeks.   . Tell the health care staff about your symptoms: fever, cough and shortness of breath. . After you have been seen by a medical provider, you will be either: o Tested for (COVID-19) and discharged home on quarantine except to seek medical care if symptoms worsen, and asked to  - Stay home and avoid contact with others until you get your results (4-5 days)  - Avoid travel on public transportation if possible (such as bus, train, or airplane) or o Sent to the Emergency Department by EMS for evaluation, COVID-19 testing, and possible  admission depending on your condition and test results.  What to do if you are LOW RISK for COVID-19?  Reduce your risk of any infection by using the same precautions used for avoiding the common cold or flu:  . Wash your hands often with soap and warm water for at least 20 seconds.  If soap and water are not readily available, use an alcohol-based hand sanitizer with at least 60% alcohol.  . If coughing or sneezing, cover your mouth and nose by coughing or sneezing into the elbow areas of your shirt or coat, into a tissue or into your sleeve (not your hands). . Avoid shaking hands with others and consider head nods or verbal greetings only. . Avoid touching your eyes, nose, or mouth with unwashed hands.  . Avoid close contact with people who are sick. . Avoid places or events with large numbers of people in one location, like concerts or sporting events. . Carefully consider travel plans you have or are making. . If you are planning any travel outside or inside the [place], visit the CDC's Travelers' Health webpage for the latest health notices. . If you have some symptoms but not all symptoms, continue to monitor at home and seek medical attention if your symptoms worsen. . If you are having a medical emergency, call 911.   ADDITIONAL HEALTHCARE OPTIONS FOR PATIENTS   [facility] Telehealth / e-Visit: [email or web address]         [facility]: [phone]  [facility]: [phone]                   [facility]: [phone]   Pre-screen negative, DM.   

## 2018-10-29 ENCOUNTER — Other Ambulatory Visit: Payer: Self-pay

## 2018-10-29 ENCOUNTER — Ambulatory Visit: Payer: BC Managed Care – PPO | Admitting: Obstetrics and Gynecology

## 2018-10-29 ENCOUNTER — Encounter: Payer: Self-pay | Admitting: Obstetrics and Gynecology

## 2018-10-29 VITALS — BP 131/82 | HR 73 | Ht 64.0 in | Wt 157.9 lb

## 2018-10-29 DIAGNOSIS — G44209 Tension-type headache, unspecified, not intractable: Secondary | ICD-10-CM | POA: Diagnosis not present

## 2018-10-29 MED ORDER — CYCLOBENZAPRINE HCL 10 MG PO TABS: 10.0000 mg | ORAL_TABLET | Freq: Three times a day (TID) | ORAL | 2 refills | Status: DC | PRN
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
## 2018-10-29 NOTE — Patient Instructions (Signed)
Tension Headache, Adult  A tension headache is a feeling of pain, pressure, or aching in the head that is often felt over the front and sides of the head. The pain can be dull, or it can feel tight (constricting). There are two types of tension headache:   Episodic tension headache. This is when the headaches happen fewer than 15 days a month.   Chronic tension headache. This is when the headaches happen more than 15 days a month during a 3-month period.  A tension headache can last from 30 minutes to several days. It is the most common kind of headache. Tension headaches are not normally associated with nausea or vomiting, and they do not get worse with physical activity.  What are the causes?  The exact cause of this condition is not known. Tension headaches are often triggered by stress, anxiety, or depression. Other triggers include:   Alcohol.   Too much caffeine or caffeine withdrawal.   Respiratory infections, such as colds, flu, or sinus infections.   Dental problems or teeth clenching.   Tiredness (fatigue).   Holding your head and neck in the same position for a long period of time, such as while using a computer.   Smoking.   Arthritis of the neck.  What are the signs or symptoms?  Symptoms of this condition include:   A feeling of pressure or tightness around the head.   Dull, aching head pain.   Pain over the front and sides of the head.   Tenderness in the muscles of the head, neck, and shoulders.  How is this diagnosed?  This condition may be diagnosed based on your symptoms, your medical history, and a physical exam. If your symptoms are severe or unusual, you may have imaging tests, such as a CT scan or an MRI of your head. Your vision may also be checked.  How is this treated?  This condition may be treated with lifestyle changes and with medicines that help relieve symptoms.  Follow these instructions at home:  Managing pain   Take over-the-counter and prescription medicines only as  told by your health care provider.   When you have a headache, lie down in a dark, quiet room.   If directed, apply ice to the head and neck:  ? Put ice in a plastic bag.  ? Place a towel between your skin and the bag.  ? Leave the ice on for 20 minutes, 2-3 times a day.   If directed, apply heat to the back of your neck as often as told by your health care provider. Use the heat source that your health care provider recommends, such as a moist heat pack or a heating pad.  ? Place a towel between your skin and the heat source.  ? Leave the heat on for 20-30 minutes.  ? Remove the heat if your skin turns bright red. This is especially important if you are unable to feel pain, heat, or cold. You may have a greater risk of getting burned.  Eating and drinking   Eat meals on a regular schedule.   Limit alcohol intake to no more than 1 drink a day for nonpregnant women and 2 drinks a day for men. One drink equals 12 oz of beer, 5 oz of wine, or 1 oz of hard liquor.   Drink enough fluid to keep your urine pale yellow.   Decrease your caffeine intake, or stop using caffeine.  Lifestyle   Get 7-9   hours of sleep each night, or get the amount of sleep recommended by your health care provider.   At bedtime, remove all electronic devices from your room. Electronic devices include computers, phones, and tablets.   Find ways to manage your stress. Some things that can help relieve stress include:  ? Exercise.  ? Deep breathing exercises.  ? Yoga.  ? Listening to music.  ? Positive mental imagery.   Try to sit up straight and avoid tensing your muscles.   Do not use any products that contain nicotine or tobacco, such as cigarettes and e-cigarettes. If you need help quitting, ask your health care provider.  General instructions     Keep all follow-up visits as told by your health care provider. This is important.   Avoid any headache triggers. Keep a headache journal to help find out what may trigger your headaches.  For example, write down:  ? What you eat and drink.  ? How much sleep you get.  ? Any change to your diet or medicines.  Contact a health care provider if:   Your headache does not get better.   Your headache comes back.   You are sensitive to sounds, light, or smells because of a headache.   You have nausea or you vomit.   Your stomach hurts.  Get help right away if:   You suddenly develop a very severe headache along with any of the following:  ? A stiff neck.  ? Nausea and vomiting.  ? Confusion.  ? Weakness.  ? Double vision or loss of vision.  ? Shortness of breath.  ? Rash.  ? Unusual sleepiness.  ? Fever.  ? Trouble speaking.  ? Pain in your eyes or ears.  ? Trouble walking or balancing.  ? Feeling faint or passing out.  Summary   A tension headache is a feeling of pain, pressure, or aching in the head that is often felt over the front and sides of the head.   A tension headache can last from 30 minutes to several days. It is the most common kind of headache.   This condition may be diagnosed based on your symptoms, your medical history, and a physical exam.   This condition may be treated with lifestyle changes and with medicines that help relieve symptoms.  This information is not intended to replace advice given to you by your health care provider. Make sure you discuss any questions you have with your health care provider.  Document Released: 04/23/2005 Document Revised: 08/03/2016 Document Reviewed: 08/03/2016  Elsevier Interactive Patient Education  2019 Elsevier Inc.

## 2018-11-25 ENCOUNTER — Encounter: Payer: BLUE CROSS/BLUE SHIELD | Admitting: Obstetrics and Gynecology

## 2018-12-05 ENCOUNTER — Encounter: Payer: Self-pay | Admitting: Obstetrics and Gynecology

## 2018-12-05 ENCOUNTER — Other Ambulatory Visit: Payer: Self-pay

## 2018-12-05 ENCOUNTER — Other Ambulatory Visit (HOSPITAL_COMMUNITY)
Admission: RE | Admit: 2018-12-05 | Discharge: 2018-12-05 | Disposition: A | Discharge status: Home / Self Care | Payer: BC Managed Care – PPO | Source: Ambulatory Visit | Attending: Obstetrics and Gynecology | Admitting: Obstetrics and Gynecology

## 2018-12-05 ENCOUNTER — Encounter: Payer: BLUE CROSS/BLUE SHIELD | Admitting: Obstetrics and Gynecology

## 2018-12-05 ENCOUNTER — Ambulatory Visit (INDEPENDENT_AMBULATORY_CARE_PROVIDER_SITE_OTHER): Payer: BC Managed Care – PPO | Admitting: Obstetrics and Gynecology

## 2018-12-05 VITALS — BP 106/68 | HR 70 | Ht 64.0 in | Wt 155.4 lb

## 2018-12-05 DIAGNOSIS — Z01419 Encounter for gynecological examination (general) (routine) without abnormal findings: Secondary | ICD-10-CM | POA: Diagnosis present

## 2018-12-05 NOTE — Progress Notes (Signed)
Subjective:   Julie Skinner is a 43 y.o. G59P2 Caucasian female here for a routine well-woman exam.  No LMP recorded. (Menstrual status: IUD).    Current complaints: headaches have resolved.  PCP: me       DOES desire labs  Social History: Sexual: heterosexual Marital Status: married Living situation: with family Occupation: Hospital doctor at [employer] Tobacco/alcohol: no tobacco use Illicit drugs: no history of illicit drug use  The following portions of the patient's history were reviewed and updated as appropriate: allergies, current medications, past family history, past medical history, past social history, past surgical history and problem list.  Past Medical History Past Medical History:  Diagnosis Date  . Left breast lump   . Myasthenia gravis ([place]) 2007  . Myasthenia gravis ([place])   . Vaginal Pap smear, abnormal     Past Surgical History Past Surgical History:  Procedure Laterality Date  . BREAST EXCISIONAL BIOPSY Left 1993   BENIGN  . BREAST SURGERY Left 1999   lump removed  . CESAREAN SECTION  [id number]  . THYMECTOMY  2007  . thymetomy  2007    Gynecologic History G2P2  No LMP recorded. (Menstrual status: IUD). Contraception: IUD (placed 05/2015) Last Pap: 2016. Results were: normal Last mammogram: 01/2018. Results were: normal   Obstetric History OB History  Gravida Para Term Preterm AB Living  2 2       2   SAB TAB Ectopic Multiple Live Births               # Outcome Date GA Lbr Len/2nd Weight Sex Delivery Anes PTL Lv  2 Para           1 Para             Obstetric Comments  1st Menstrual Cycle: 10  1st Pregnancy:  29    Current Medications Current Outpatient Medications on File Prior to Visit  Medication Sig Dispense Refill  . Biotin 1000 MCG tablet Take 1,000 mcg by mouth 3 (three) times daily.    . cholecalciferol (VITAMIN D) 1000 UNITS tablet Take 1,000 Units by mouth as needed. Reported on 05/04/2015    . gabapentin (NEURONTIN) 600 MG  tablet Take 2 tablets at bedtime    . levonorgestrel (MIRENA) 20 MCG/24HR IUD 1 each by Intrauterine route once.    Marland Kitchen losartan (COZAAR) 50 MG tablet TAKE 1 TABLET(50 MG) BY MOUTH DAILY 30 tablet 6  . traMADol (ULTRAM) 50 MG tablet Take 50 mg by mouth every 6 (six) hours as needed. Reported on 09/01/2015    . cyclobenzaprine (FLEXERIL) 10 MG tablet Take 1 tablet (10 mg total) by mouth 3 (three) times daily as needed for muscle spasms. (Patient not taking: Reported on 12/05/2018) 30 tablet 2   Current Facility-Administered Medications on File Prior to Visit  Medication Dose Route Frequency Provider Last Rate Last Dose  . cyanocobalamin ((VITAMIN B-12)) injection 1,000 mcg  1,000 mcg Intramuscular Once [place], [employer], CNM      . cyanocobalamin ((VITAMIN B-12)) injection 1,000 mcg  1,000 mcg Intramuscular Once [place], Melody N, CNM        Review of Systems Patient denies any headaches, blurred vision, shortness of breath, chest pain, abdominal pain, problems with bowel movements, urination, or intercourse.  Objective:  BP 106/68   Pulse 70   Ht 5\' 4"  (1.626 m)   Wt 155 lb 6.4 oz (70.5 kg)   BMI 26.67 kg/m  Physical Exam  General:  Well developed, well  nourished, no acute distress. She is alert and oriented x3. Skin:  Warm and dry Neck:  Midline trachea, no thyromegaly or nodules Cardiovascular: Regular rate and rhythm, no murmur heard Lungs:  Effort normal, all lung fields clear to auscultation bilaterally Breasts:  No dominant palpable mass, retraction, or nipple discharge Abdomen:  Soft, non tender, no hepatosplenomegaly or masses Pelvic:  External genitalia is normal in appearance.  The vagina is normal in appearance. The cervix is bulbous, no CMT.  Thin prep pap is done with HR HPV cotesting. Uterus is felt to be normal size, shape, and contour.  No adnexal masses or tenderness noted. IUD string noted. Extremities:  No swelling or varicosities noted Psych:  She has a normal mood  and affect  Assessment:   Healthy well-woman exam IUD check BMI 26  Plan:  Will return next week for labs as tech is out, will follow up accordingly F/U 1 year for AE, or sooner if needed Mammogram ordered  Melody Rockney Ghee, CNM

## 2018-12-05 NOTE — Patient Instructions (Signed)
 Preventive Care 21-45 Years Old, Female Preventive care refers to visits with your health care provider and lifestyle choices that can promote health and wellness. This includes:  A yearly physical exam. This may also be called an annual well check.  Regular dental visits and eye exams.  Immunizations.  Screening for certain conditions.  Healthy lifestyle choices, such as eating a healthy diet, getting regular exercise, not using drugs or products that contain nicotine and tobacco, and limiting alcohol use. What can I expect for my preventive care visit? Physical exam Your health care provider will check your:  Height and weight. This may be used to calculate body mass index (BMI), which tells if you are at a healthy weight.  Heart rate and blood pressure.  Skin for abnormal spots. Counseling Your health care provider may ask you questions about your:  Alcohol, tobacco, and drug use.  Emotional well-being.  Home and relationship well-being.  Sexual activity.  Eating habits.  Work and work environment.  Method of birth control.  Menstrual cycle.  Pregnancy history. What immunizations do I need?  Influenza (flu) vaccine  This is recommended every year. Tetanus, diphtheria, and pertussis (Tdap) vaccine  You may need a Td booster every 10 years. Varicella (chickenpox) vaccine  You may need this if you have not been vaccinated. Human papillomavirus (HPV) vaccine  If recommended by your health care provider, you may need three doses over 6 months. Measles, mumps, and rubella (MMR) vaccine  You may need at least one dose of MMR. You may also need a second dose. Meningococcal conjugate (MenACWY) vaccine  One dose is recommended if you are age 19-21 years and a first-year college student living in a residence hall, or if you have one of several medical conditions. You may also need additional booster doses. Pneumococcal conjugate (PCV13) vaccine  You may need  this if you have certain conditions and were not previously vaccinated. Pneumococcal polysaccharide (PPSV23) vaccine  You may need one or two doses if you smoke cigarettes or if you have certain conditions. Hepatitis A vaccine  You may need this if you have certain conditions or if you travel or work in places where you may be exposed to hepatitis A. Hepatitis B vaccine  You may need this if you have certain conditions or if you travel or work in places where you may be exposed to hepatitis B. Haemophilus influenzae type b (Hib) vaccine  You may need this if you have certain conditions. You may receive vaccines as individual doses or as more than one vaccine together in one shot (combination vaccines). Talk with your health care provider about the risks and benefits of combination vaccines. What tests do I need?  Blood tests  Lipid and cholesterol levels. These may be checked every 5 years starting at age 20.  Hepatitis C test.  Hepatitis B test. Screening  Diabetes screening. This is done by checking your blood sugar (glucose) after you have not eaten for a while (fasting).  Sexually transmitted disease (STD) testing.  BRCA-related cancer screening. This may be done if you have a family history of breast, ovarian, tubal, or peritoneal cancers.  Pelvic exam and Pap test. This may be done every 3 years starting at age 21. Starting at age 30, this may be done every 5 years if you have a Pap test in combination with an HPV test. Talk with your health care provider about your test results, treatment options, and if necessary, the need for more   tests. Follow these instructions at home: Eating and drinking   Eat a diet that includes fresh fruits and vegetables, whole grains, lean protein, and low-fat dairy.  Take vitamin and mineral supplements as recommended by your health care provider.  Do not drink alcohol if: ? Your health care provider tells you not to drink. ? You are  pregnant, may be pregnant, or are planning to become pregnant.  If you drink alcohol: ? Limit how much you have to 0-1 drink a day. ? Be aware of how much alcohol is in your drink. In the [place] one drink equals one 12 oz bottle of beer (355 mL), one 5 oz glass of wine (148 mL), or one 1 oz glass of hard liquor (44 mL). Lifestyle  Take daily care of your teeth and gums.  Stay active. Exercise for at least 30 minutes on 5 or more days each week.  Do not use any products that contain nicotine or tobacco, such as cigarettes, e-cigarettes, and chewing tobacco. If you need help quitting, ask your health care provider.  If you are sexually active, practice safe sex. Use a condom or other form of birth control (contraception) in order to prevent pregnancy and STIs (sexually transmitted infections). If you plan to become pregnant, see your health care provider for a preconception visit. What's next?  Visit your health care provider once a year for a well check visit.  Ask your health care provider how often you should have your eyes and teeth checked.  Stay up to date on all vaccines. This information is not intended to replace advice given to you by your health care provider. Make sure you discuss any questions you have with your health care provider. Document Released: 06/19/2001 Document Revised: 01/02/2018 Document Reviewed: 01/02/2018 Elsevier Patient Education  2020 [employer].

## 2018-12-09 ENCOUNTER — Other Ambulatory Visit: Payer: Self-pay

## 2018-12-09 ENCOUNTER — Other Ambulatory Visit: Payer: BC Managed Care – PPO

## 2018-12-09 DIAGNOSIS — E559 Vitamin D deficiency, unspecified: Secondary | ICD-10-CM

## 2018-12-09 DIAGNOSIS — G7000 Myasthenia gravis without (acute) exacerbation: Secondary | ICD-10-CM

## 2018-12-09 DIAGNOSIS — E7800 Pure hypercholesterolemia, unspecified: Secondary | ICD-10-CM

## 2018-12-09 DIAGNOSIS — Z01419 Encounter for gynecological examination (general) (routine) without abnormal findings: Secondary | ICD-10-CM

## 2018-12-09 MED ORDER — METRONIDAZOLE 500 MG PO TABS: 500.0000 mg | ORAL_TABLET | Freq: Two times a day (BID) | ORAL | 0 refills | Status: DC

## 2018-12-10 LAB — COMPREHENSIVE METABOLIC PANEL
ALT: 10 IU/L (ref 0–32)
AST: 14 IU/L (ref 0–40)
Albumin/Globulin Ratio: 2.1 (ref 1.2–2.2)
Albumin: 4.2 g/dL (ref 3.8–4.8)
Alkaline Phosphatase: 38 IU/L — ABNORMAL LOW (ref 39–117)
BUN/Creatinine Ratio: 18 (ref 9–23)
BUN: 12 mg/dL (ref 6–24)
Bilirubin Total: 0.4 mg/dL (ref 0.0–1.2)
CO2: 25 mmol/L (ref 20–29)
Calcium: 9 mg/dL (ref 8.7–10.2)
Chloride: 100 mmol/L (ref 96–106)
Creatinine, Ser: 0.65 mg/dL (ref 0.57–1.00)
GFR calc Af Amer: 124 mL/min/{1.73_m2} (ref >59)
GFR calc non Af Amer: 108 mL/min/{1.73_m2} (ref >59)
Globulin, Total: 2 g/dL (ref 1.5–4.5)
Glucose: 85 mg/dL (ref 65–99)
Potassium: 4.3 mmol/L (ref 3.5–5.2)
Sodium: 138 mmol/L (ref 134–144)
Total Protein: 6.2 g/dL (ref 6.0–8.5)

## 2018-12-10 LAB — HEMOGLOBIN A1C
Est. average glucose Bld gHb Est-mCnc: 105 mg/dL
Hgb A1c MFr Bld: 5.3 % (ref 4.8–5.6)

## 2018-12-10 LAB — LIPID PANEL
Chol/HDL Ratio: 2.5 ratio (ref 0.0–4.4)
Cholesterol, Total: 183 mg/dL (ref 100–199)
HDL: 73 mg/dL (ref >39)
LDL Calculated: 99 mg/dL (ref 0–99)
Triglycerides: 55 mg/dL (ref 0–149)
VLDL Cholesterol Cal: 11 mg/dL (ref 5–40)

## 2018-12-10 LAB — TSH: TSH: 0.907 u[IU]/mL (ref 0.450–4.500)

## 2018-12-10 LAB — VITAMIN D 25 HYDROXY (VIT D DEFICIENCY, FRACTURES): Vit D, 25-Hydroxy: 47.9 ng/mL (ref 30.0–100.0)

## 2018-12-14 ENCOUNTER — Other Ambulatory Visit: Payer: Self-pay | Admitting: Obstetrics and Gynecology

## 2018-12-15 ENCOUNTER — Other Ambulatory Visit: Payer: Self-pay

## 2018-12-15 MED ORDER — LOSARTAN POTASSIUM 50 MG PO TABS: ORAL_TABLET | ORAL | 6 refills | Status: DC

## 2018-12-15 NOTE — Telephone Encounter (Signed)
Refill sent.

## 2018-12-17 LAB — CYTOLOGY - PAP
Diagnosis: NEGATIVE
HPV 16/18/45 genotyping: NEGATIVE
HPV: DETECTED — AB

## 2019-02-20 ENCOUNTER — Ambulatory Visit
Admission: RE | Admit: 2019-02-20 | Discharge: 2019-02-20 | Disposition: A | Discharge status: Home / Self Care | Payer: BC Managed Care – PPO | Source: Ambulatory Visit | Attending: Obstetrics and Gynecology | Admitting: Obstetrics and Gynecology

## 2019-02-20 DIAGNOSIS — N6489 Other specified disorders of breast: Secondary | ICD-10-CM | POA: Diagnosis not present

## 2019-02-20 DIAGNOSIS — Z1231 Encounter for screening mammogram for malignant neoplasm of breast: Secondary | ICD-10-CM | POA: Insufficient documentation

## 2019-02-20 DIAGNOSIS — N6310 Unspecified lump in the right breast, unspecified quadrant: Secondary | ICD-10-CM | POA: Insufficient documentation

## 2019-02-20 DIAGNOSIS — Z01419 Encounter for gynecological examination (general) (routine) without abnormal findings: Secondary | ICD-10-CM

## 2019-02-23 ENCOUNTER — Other Ambulatory Visit: Payer: Self-pay | Admitting: Obstetrics and Gynecology

## 2019-02-23 DIAGNOSIS — R928 Other abnormal and inconclusive findings on diagnostic imaging of breast: Secondary | ICD-10-CM

## 2019-02-23 DIAGNOSIS — N6489 Other specified disorders of breast: Secondary | ICD-10-CM

## 2019-02-23 DIAGNOSIS — N6310 Unspecified lump in the right breast, unspecified quadrant: Secondary | ICD-10-CM

## 2019-02-24 ENCOUNTER — Other Ambulatory Visit: Payer: Self-pay | Admitting: Obstetrics and Gynecology

## 2019-02-24 DIAGNOSIS — R928 Other abnormal and inconclusive findings on diagnostic imaging of breast: Secondary | ICD-10-CM

## 2019-03-04 ENCOUNTER — Ambulatory Visit
Admission: RE | Admit: 2019-03-04 | Discharge: 2019-03-04 | Disposition: A | Discharge status: Home / Self Care | Payer: BC Managed Care – PPO | Source: Ambulatory Visit | Attending: Obstetrics and Gynecology | Admitting: Obstetrics and Gynecology

## 2019-03-04 DIAGNOSIS — N6489 Other specified disorders of breast: Secondary | ICD-10-CM

## 2019-03-04 DIAGNOSIS — R928 Other abnormal and inconclusive findings on diagnostic imaging of breast: Secondary | ICD-10-CM | POA: Diagnosis not present

## 2019-03-04 DIAGNOSIS — N6310 Unspecified lump in the right breast, unspecified quadrant: Secondary | ICD-10-CM

## 2019-03-05 ENCOUNTER — Other Ambulatory Visit: Payer: Self-pay | Admitting: Obstetrics and Gynecology

## 2019-03-05 DIAGNOSIS — N6320 Unspecified lump in the left breast, unspecified quadrant: Secondary | ICD-10-CM

## 2019-03-05 DIAGNOSIS — R928 Other abnormal and inconclusive findings on diagnostic imaging of breast: Secondary | ICD-10-CM

## 2019-03-13 ENCOUNTER — Ambulatory Visit
Admission: RE | Admit: 2019-03-13 | Discharge: 2019-03-13 | Disposition: A | Discharge status: Home / Self Care | Payer: BC Managed Care – PPO | Source: Ambulatory Visit | Attending: Obstetrics and Gynecology | Admitting: Obstetrics and Gynecology

## 2019-03-13 DIAGNOSIS — R928 Other abnormal and inconclusive findings on diagnostic imaging of breast: Secondary | ICD-10-CM

## 2019-03-13 DIAGNOSIS — N6320 Unspecified lump in the left breast, unspecified quadrant: Secondary | ICD-10-CM

## 2019-03-13 HISTORY — PX: BREAST BIOPSY: SHX20

## 2019-03-16 LAB — SURGICAL PATHOLOGY

## 2019-04-07 ENCOUNTER — Other Ambulatory Visit: Payer: Self-pay

## 2019-04-07 DIAGNOSIS — Z20822 Contact with and (suspected) exposure to covid-19: Secondary | ICD-10-CM

## 2019-04-09 LAB — NOVEL CORONAVIRUS, NAA: SARS-CoV-2, NAA: NOT DETECTED

## 2019-07-07 ENCOUNTER — Other Ambulatory Visit: Payer: Self-pay

## 2019-07-07 MED ORDER — LOSARTAN POTASSIUM 50 MG PO TABS: ORAL_TABLET | ORAL | 6 refills | Status: DC

## 2019-08-26 ENCOUNTER — Telehealth: Payer: Self-pay

## 2019-08-26 ENCOUNTER — Telehealth: Payer: Self-pay | Admitting: Certified Nurse Midwife

## 2019-08-26 NOTE — Telephone Encounter (Signed)
mychart message sent to patient

## 2019-08-26 NOTE — Telephone Encounter (Signed)
Pt called in and stated that she is spotting the pt has iud. She wanted to know is that normal. The pt is requesting a call or mychart message. Please advise

## 2019-09-23 ENCOUNTER — Encounter: Payer: Self-pay | Admitting: Dermatology

## 2019-09-23 ENCOUNTER — Other Ambulatory Visit: Payer: Self-pay

## 2019-09-23 ENCOUNTER — Ambulatory Visit: Payer: BC Managed Care – PPO | Admitting: Dermatology

## 2019-09-23 DIAGNOSIS — D492 Neoplasm of unspecified behavior of bone, soft tissue, and skin: Secondary | ICD-10-CM

## 2019-09-23 DIAGNOSIS — D225 Melanocytic nevi of trunk: Secondary | ICD-10-CM

## 2019-09-23 NOTE — Patient Instructions (Signed)

## 2019-09-23 NOTE — Progress Notes (Signed)
   Follow-Up Visit   Subjective  Julie Skinner is a 44 y.o. female who presents for the following: check spot (back, has gotten larger and painful).   The following portions of the chart were reviewed this encounter and updated as appropriate:  Tobacco  Allergies  Meds  Problems  Med Hx  Surg Hx  Fam Hx      Review of Systems:  No other skin or systemic complaints except as noted in HPI or Assessment and Plan.  Objective  Well appearing patient in no apparent distress; mood and affect are within normal limits.  A focused examination was performed including back. Relevant physical exam findings are noted in the Assessment and Plan.  Objective  Right Lower Back lateral: 0.6cm pink pap   Assessment & Plan  Neoplasm of skin Right Lower Back lateral  Epidermal / dermal shaving  Lesion length (cm):  0.6 Lesion width (cm):  0.6 Margin per side (cm):  0.2 Total excision diameter (cm):  1  Specimen 1 - Surgical pathology Differential Diagnosis: D48.5 Irritated nevus r/o Dysplasia Check Margins: No 0.6cm pink pap  Return for as scheduled 10/28/19 at 8:30am for TBSE.  I, Othelia Pulling, RMA, am acting as scribe for Sarina Ser, MD .  Documentation: I have reviewed the above documentation for accuracy and completeness, and I agree with the above.  Sarina Ser, MD

## 2019-09-27 ENCOUNTER — Encounter: Payer: Self-pay | Admitting: Dermatology

## 2019-09-28 ENCOUNTER — Telehealth: Payer: Self-pay

## 2019-09-28 NOTE — Telephone Encounter (Signed)
Biopsy results discussed with pt  

## 2019-10-27 ENCOUNTER — Encounter: Payer: Self-pay | Admitting: Certified Nurse Midwife

## 2019-10-27 ENCOUNTER — Other Ambulatory Visit: Payer: Self-pay

## 2019-10-27 ENCOUNTER — Ambulatory Visit (INDEPENDENT_AMBULATORY_CARE_PROVIDER_SITE_OTHER): Payer: BC Managed Care – PPO | Admitting: Certified Nurse Midwife

## 2019-10-27 VITALS — BP 132/77 | HR 74 | Ht 64.0 in | Wt 139.0 lb

## 2019-10-27 DIAGNOSIS — Z30433 Encounter for removal and reinsertion of intrauterine contraceptive device: Secondary | ICD-10-CM | POA: Diagnosis not present

## 2019-10-27 NOTE — Patient Instructions (Addendum)
Intrauterine Device Insertion, Care After  This sheet gives you information about how to care for yourself after your procedure. Your health care provider may also give you more specific instructions. If you have problems or questions, contact your health care provider. What can I expect after the procedure? After the procedure, it is common to have:  Cramps and pain in the abdomen.  Light bleeding (spotting) or heavier bleeding that is like your menstrual period. This may last for up to a few days.  Lower back pain.  Dizziness.  Headaches.  Nausea. Follow these instructions at home:  Before resuming sexual activity, check to make sure that you can feel the IUD string(s). You should be able to feel the end of the string(s) below the opening of your cervix. If your IUD string is in place, you may resume sexual activity. ? If you had a hormonal IUD inserted more than 7 days after your most recent period started, you will need to use a backup method of birth control for 7 days after IUD insertion. Ask your health care provider whether this applies to you.  Continue to check that the IUD is still in place by feeling for the string(s) after every menstrual period, or once a month.  Take over-the-counter and prescription medicines only as told by your health care provider.  Do not drive or use heavy machinery while taking prescription pain medicine.  Keep all follow-up visits as told by your health care provider. This is important. Contact a health care provider if:  You have bleeding that is heavier or lasts longer than a normal menstrual cycle.  You have a fever.  You have cramps or abdominal pain that get worse or do not get better with medicine.  You develop abdominal pain that is new or is not in the same area of earlier cramping and pain.  You feel lightheaded or weak.  You have abnormal or bad-smelling discharge from your vagina.  You have pain during sexual  activity.  You have any of the following problems with your IUD string(s): ? The string bothers or hurts you or your sexual partner. ? You cannot feel the string. ? The string has gotten longer.  You can feel the IUD in your vagina.  You think you may be pregnant, or you miss your menstrual period.  You think you may have an STI (sexually transmitted infection). Get help right away if:  You have flu-like symptoms.  You have a fever and chills.  You can feel that your IUD has slipped out of place. Summary  After the procedure, it is common to have cramps and pain in the abdomen. It is also common to have light bleeding (spotting) or heavier bleeding that is like your menstrual period.  Continue to check that the IUD is still in place by feeling for the string(s) after every menstrual period, or once a month.  Keep all follow-up visits as told by your health care provider. This is important.  Contact your health care provider if you have problems with your IUD string(s), such as the string getting longer or bothering you or your sexual partner. This information is not intended to replace advice given to you by your health care provider. Make sure you discuss any questions you have with your health care provider. Document Revised: 04/05/2017 Document Reviewed: 03/14/2016 Elsevier Patient Education  2020 Elsevier Inc.  

## 2019-10-27 NOTE — Progress Notes (Signed)
    GYNECOLOGY OFFICE PROCEDURE NOTE  Julie Skinner is a 44 y.o. G2P2 here for mirena IUD removal and reinsertion. No GYN concerns.  Last pap smear was on 12/04/18 and was normal , HPV positive.  IUD Removal and Reinsertion  Patient identified, informed consent performed, consent signed.   Discussed risks of irregular bleeding, cramping, infection, malpositioning or misplacement of the IUD outside the uterus which may require further procedures. Also discussed >99% contraception efficacy, increased risk of ectopic pregnancy with failure of method.  Advised to use backup contraception for one week as the risk of pregnancy is higher during the transition period of removing an IUD and replacing it with another one. Time out was performed. Speculum placed in the vagina. The strings of the IUD were grasped and pulled using ring forceps. The IUD was successfully removed in its entirety. The cervix was cleaned with Betadine x 2 and grasped anteriorly with a single tooth tenaculum.  The new Mirena IUD insertion apparatus was used to sound the uterus to 7 cm;  the IUD was then placed per manufacturer's recommendations. Strings trimmed to 3 cm. Tenaculum was removed, good hemostasis noted. Patient tolerated procedure well.   Patient was given post-procedure instructions.  She was reminded to have backup contraception for one week during this transition period between IUDs.  Patient was also asked to check IUD strings periodically and follow up in 4 weeks for IUD check.   Philip Aspen, CNM

## 2019-10-28 ENCOUNTER — Encounter: Payer: Self-pay | Admitting: Dermatology

## 2019-10-28 ENCOUNTER — Ambulatory Visit (INDEPENDENT_AMBULATORY_CARE_PROVIDER_SITE_OTHER): Payer: BC Managed Care – PPO | Admitting: Dermatology

## 2019-10-28 DIAGNOSIS — B360 Pityriasis versicolor: Secondary | ICD-10-CM | POA: Diagnosis not present

## 2019-10-28 DIAGNOSIS — D239 Other benign neoplasm of skin, unspecified: Secondary | ICD-10-CM

## 2019-10-28 DIAGNOSIS — L719 Rosacea, unspecified: Secondary | ICD-10-CM

## 2019-10-28 DIAGNOSIS — Z85828 Personal history of other malignant neoplasm of skin: Secondary | ICD-10-CM

## 2019-10-28 DIAGNOSIS — Z1283 Encounter for screening for malignant neoplasm of skin: Secondary | ICD-10-CM | POA: Diagnosis not present

## 2019-10-28 DIAGNOSIS — Z86018 Personal history of other benign neoplasm: Secondary | ICD-10-CM

## 2019-10-28 DIAGNOSIS — D1800 Hemangioma unspecified site: Secondary | ICD-10-CM

## 2019-10-28 DIAGNOSIS — L821 Other seborrheic keratosis: Secondary | ICD-10-CM

## 2019-10-28 DIAGNOSIS — L578 Other skin changes due to chronic exposure to nonionizing radiation: Secondary | ICD-10-CM

## 2019-10-28 DIAGNOSIS — D2372 Other benign neoplasm of skin of left lower limb, including hip: Secondary | ICD-10-CM | POA: Diagnosis not present

## 2019-10-28 DIAGNOSIS — D229 Melanocytic nevi, unspecified: Secondary | ICD-10-CM

## 2019-10-28 DIAGNOSIS — L814 Other melanin hyperpigmentation: Secondary | ICD-10-CM

## 2019-10-28 MED ORDER — KETOCONAZOLE 2 % EX SHAM: MEDICATED_SHAMPOO | CUTANEOUS | 6 refills | Status: DC

## 2019-10-28 NOTE — Progress Notes (Signed)
   Follow-Up Visit   Subjective  Julie Skinner is a 44 y.o. female who presents for the following: Annual Exam (Hx dysplastic nevus on the right knee). The patient presents for Total-Body Skin Exam (TBSE) for skin cancer screening and mole check.  The following portions of the chart were reviewed this encounter and updated as appropriate:  Tobacco  Allergies  Meds  Problems  Med Hx  Surg Hx  Fam Hx      Review of Systems:  No other skin or systemic complaints except as noted in HPI or Assessment and Plan.  Objective  Well appearing patient in no apparent distress; mood and affect are within normal limits.  A full examination was performed including scalp, head, eyes, ears, nose, lips, neck, chest, axillae, abdomen, back, buttocks, bilateral upper extremities, bilateral lower extremities, hands, feet, fingers, toes, fingernails, and toenails. All findings within normal limits unless otherwise noted below.  Objective  Face: Pinkness  Objective  Left Knee - Anterior: Firm flesh papule  Objective  R lat knee: Scar with no evidence of recurrence.   Objective  Chest: Clear today   Assessment & Plan    Rosacea Face  Benign, observe.    Dermatofibroma Left Knee - Anterior  Benign, observe.    History of dysplastic nevus R lat knee  Clear. Observe for recurrence. Call clinic for new or changing lesions.  Recommend regular skin exams, daily broad-spectrum spf 30+ sunscreen use, and photoprotection.     Tinea versicolor Chest  Continue Ketoconazole 2% shampoo QW  ketoconazole (NIZORAL) 2 % shampoo - Chest  Skin cancer screening   Lentigines - Scattered tan macules - Discussed due to sun exposure - Benign, observe - Call for any changes  Seborrheic Keratoses - Stuck-on, waxy, tan-brown papules and plaques  - Discussed benign etiology and prognosis. - Observe - Call for any changes  Melanocytic Nevi - Tan-brown and/or pink-flesh-colored  symmetric macules and papules - Benign appearing on exam today - Observation - Call clinic for new or changing moles - Recommend daily use of broad spectrum spf 30+ sunscreen to sun-exposed areas.   Hemangiomas - Red papules - Discussed benign nature - Observe - Call for any changes  Actinic Damage - diffuse scaly erythematous macules with underlying dyspigmentation - Recommend daily broad spectrum sunscreen SPF 30+ to sun-exposed areas, reapply every 2 hours as needed.  - Call for new or changing lesions.  History of Basal Cell Carcinoma of the Skin - No evidence of recurrence today - Recommend regular full body skin exams - Recommend daily broad spectrum sunscreen SPF 30+ to sun-exposed areas, reapply every 2 hours as needed.  - Call if any new or changing lesions are noted between office visits   Skin cancer screening performed today.   Return in about 1 year (around 10/27/2020) for TBSE.  Luther Redo, CMA, am acting as scribe for Sarina Ser, MD .  Documentation: I have reviewed the above documentation for accuracy and completeness, and I agree with the above.  Sarina Ser, MD

## 2019-11-03 ENCOUNTER — Encounter: Payer: Self-pay | Admitting: Dermatology

## 2019-12-09 ENCOUNTER — Encounter: Payer: BC Managed Care – PPO | Admitting: Obstetrics and Gynecology

## 2019-12-09 ENCOUNTER — Encounter: Payer: Self-pay | Admitting: Certified Nurse Midwife

## 2019-12-09 ENCOUNTER — Other Ambulatory Visit (HOSPITAL_COMMUNITY)
Admission: RE | Admit: 2019-12-09 | Discharge: 2019-12-09 | Disposition: A | Discharge status: Home / Self Care | Payer: BC Managed Care – PPO | Source: Ambulatory Visit | Attending: Obstetrics and Gynecology | Admitting: Obstetrics and Gynecology

## 2019-12-09 ENCOUNTER — Ambulatory Visit (INDEPENDENT_AMBULATORY_CARE_PROVIDER_SITE_OTHER): Payer: BC Managed Care – PPO | Admitting: Certified Nurse Midwife

## 2019-12-09 DIAGNOSIS — Z1159 Encounter for screening for other viral diseases: Secondary | ICD-10-CM | POA: Diagnosis not present

## 2019-12-09 DIAGNOSIS — Z1231 Encounter for screening mammogram for malignant neoplasm of breast: Secondary | ICD-10-CM

## 2019-12-09 DIAGNOSIS — R350 Frequency of micturition: Secondary | ICD-10-CM

## 2019-12-09 DIAGNOSIS — Z01419 Encounter for gynecological examination (general) (routine) without abnormal findings: Secondary | ICD-10-CM

## 2019-12-09 DIAGNOSIS — Z124 Encounter for screening for malignant neoplasm of cervix: Secondary | ICD-10-CM | POA: Diagnosis present

## 2019-12-09 DIAGNOSIS — Z8669 Personal history of other diseases of the nervous system and sense organs: Secondary | ICD-10-CM

## 2019-12-09 DIAGNOSIS — Z114 Encounter for screening for human immunodeficiency virus [HIV]: Secondary | ICD-10-CM | POA: Diagnosis not present

## 2019-12-09 MED ORDER — LOSARTAN POTASSIUM 50 MG PO TABS: ORAL_TABLET | ORAL | 6 refills | Status: DC

## 2019-12-09 NOTE — Progress Notes (Signed)
GYNECOLOGY ANNUAL PREVENTATIVE CARE ENCOUNTER NOTE  History:     Julie Skinner is a 44 y.o. [id number] female here for a routine annual gynecologic exam.  Current complaints: none.Weight loss. Has history myasthenia gravis.   Denies abnormal vaginal bleeding, discharge, pelvic pain, problems with intercourse or other gynecologic concerns.     Social  Relationship: married  Living: with family  Work: :am Designer, [employer];  Smoke/alcohol/drugs: denies  Gynecologic History No LMP recorded. (Menstrual status: IUD). Contraception: IUD Last Pap: 12/05/18 Results were: normal with positive HPV Last mammogram: 03/04/19. Results were: abnormal  Obstetric History OB History  Gravida Para Term Preterm AB Living  2 2 2     2   SAB TAB Ectopic Multiple Live Births          2    # Outcome Date GA Lbr Len/2nd Weight Sex Delivery Anes PTL Lv  2 Term 06/29/07   7 lb (3.175 kg) M CS-Unspec  N LIV  1 Term 01/12/05   8 lb (3.629 kg) F CS-Unspec  N LIV     Complications: Breech extraction    Obstetric Comments  1st Menstrual Cycle: 10  1st Pregnancy:  29    Past Medical History:  Diagnosis Date  . Hx of basal cell carcinoma 12/30/2014   L lateral thigh  . Hx of dysplastic nevus 10/08/2017   R lat knee  . Left breast lump   . Myasthenia gravis ([place]) 2007  . Myasthenia gravis ([place])   . Vaginal Pap smear, abnormal     Past Surgical History:  Procedure Laterality Date  . BREAST BIOPSY Left 2015?   benign, with marker, at Dr. Dwyane Luo office  . BREAST EXCISIONAL BIOPSY Left 1993   BENIGN  . BREAST SURGERY Left 1999   lump removed  . CESAREAN SECTION  [id number]  . THYMECTOMY  2007  . thymetomy  2007    Current Outpatient Medications on File Prior to Visit  Medication Sig Dispense Refill  . cholecalciferol (VITAMIN D) 1000 UNITS tablet Take 1,000 Units by mouth as needed. Reported on 05/04/2015    . gabapentin (NEURONTIN) 600 MG tablet Take 2 tablets at bedtime    .  levonorgestrel (MIRENA) 20 MCG/24HR IUD 1 each by Intrauterine route once.    Marland Kitchen losartan (COZAAR) 50 MG tablet TAKE 1 TABLET(50 MG) BY MOUTH DAILY 30 tablet 6  . traMADol (ULTRAM) 50 MG tablet Take 50 mg by mouth every 6 (six) hours as needed. Reported on 09/01/2015     Current Facility-Administered Medications on File Prior to Visit  Medication Dose Route Frequency Provider Last Rate Last Admin  . cyanocobalamin ((VITAMIN B-12)) injection 1,000 mcg  1,000 mcg Intramuscular Once [place], [employer], CNM      . cyanocobalamin ((VITAMIN B-12)) injection 1,000 mcg  1,000 mcg Intramuscular Once [place], Melody N, CNM        No Known Allergies  Social History:  reports that she has never smoked. She has never used smokeless tobacco. She reports current alcohol use. She reports that she does not use drugs.  Family History  Problem Relation Age of Onset  . Heart disease Mother   . Hypertension Mother   . Heart disease Father   . Hypertension Father   . Heart disease Maternal Grandmother   . Cancer Maternal Grandfather        breast  . Breast cancer Paternal Grandmother 3    The following portions of the patient's history were  reviewed and updated as appropriate: allergies, current medications, past family history, past medical history, past social history, past surgical history and problem list.  Review of Systems Pertinent items noted in HPI and remainder of comprehensive ROS otherwise negative.  Physical Exam:  There were no vitals taken for this visit. CONSTITUTIONAL: Well-developed, well-nourished female in no acute distress.  HENT:  Normocephalic, atraumatic, External right and left ear normal. Oropharynx is clear and moist EYES: Conjunctivae and EOM are normal. Pupils are equal, round, and reactive to light. No scleral icterus.  NECK: Normal range of motion, supple, no masses.  Normal thyroid.  SKIN: Skin is warm and dry. No rash noted. Not diaphoretic. No erythema. No  pallor. MUSCULOSKELETAL: Normal range of motion. No tenderness.  No cyanosis, clubbing, or edema.  2+ distal pulses. NEUROLOGIC: Alert and oriented to person, place, and time. Normal reflexes, muscle tone coordination.  PSYCHIATRIC: Normal mood and affect. Normal behavior. Normal judgment and thought content. CARDIOVASCULAR: Normal heart rate noted, regular rhythm RESPIRATORY: Clear to auscultation bilaterally. Effort and breath sounds normal, no problems with respiration noted. BREASTS: Symmetric in size. No masses, tenderness, skin changes, nipple drainage, or lymphadenopathy bilaterally. Performed in the presence of a chaperone. ABDOMEN: Soft, no distention noted.  No tenderness, rebound or guarding.  PELVIC: Normal appearing external genitalia and urethral meatus; normal appearing vaginal mucosa and cervix.  No abnormal discharge noted.  Pap smear obtained.  Normal uterine size, no other palpable masses, no uterine or adnexal tenderness.  Performed in the presence of a chaperone.   Assessment and Plan:    1. Women's annual routine gynecological examination  Will follow up results of pap smear and manage accordingly. Mammogram scheduled  Labs: HIV, HepC, TSh and panel, urine culture Refills:losartan  Referral: none Tetanus: thinks she had it done, going to look at [facility] chart. Routine preventative health maintenance measures emphasized. Please refer to After Visit Summary for other counseling recommendations.      Verita Schneiders, MD, [place] for [employer] [facility], [place]

## 2019-12-09 NOTE — Patient Instructions (Signed)

## 2019-12-10 LAB — HIV ANTIBODY (ROUTINE TESTING W REFLEX): HIV Screen 4th Generation wRfx: NONREACTIVE

## 2019-12-10 LAB — HEPATITIS C ANTIBODY: Hep C Virus Ab: <0.1 s/co ratio (ref 0.0–0.9)

## 2019-12-10 LAB — THYROID PANEL WITH TSH
Free Thyroxine Index: 2.1 (ref 1.2–4.9)
T3 Uptake Ratio: 28 % (ref 24–39)
T4, Total: 7.4 ug/dL (ref 4.5–12.0)
TSH: 0.963 u[IU]/mL (ref 0.450–4.500)

## 2019-12-11 LAB — URINE CULTURE: Organism ID, Bacteria: NO GROWTH

## 2019-12-11 LAB — CYTOLOGY - PAP
Comment: NEGATIVE
Diagnosis: NEGATIVE
High risk HPV: NEGATIVE

## 2019-12-18 ENCOUNTER — Other Ambulatory Visit: Payer: Self-pay | Admitting: Certified Nurse Midwife

## 2019-12-18 DIAGNOSIS — N6489 Other specified disorders of breast: Secondary | ICD-10-CM

## 2020-02-02 ENCOUNTER — Telehealth: Payer: Self-pay

## 2020-02-02 NOTE — Telephone Encounter (Signed)
Patient called stating she has developed a new "irritation" all over her body with the neck being the most bothersome. Last night she did cover her body in the Sarna Cream and Hydrocortisone. She also took a Benadryl with no relief. She has no rash or hives for this to be visible.   Patient states you noticed a spot above her eye once and treated her for shingles. She is concerned this could be related?

## 2020-02-02 NOTE — Telephone Encounter (Signed)
Shingles occurs only in one area, not all over body, so unlikely shingles. Other than taking an antihistamine (would recommend Allegra 180 mg qd as does not make drowsy like Benadryl), I would not have other recommendations. If this persists, we can make an appt to evaluate and discuss testing and treatment. Last visit was June and I see no evidence that we evaluated a rash.  IF pt wants an appt, please work in with me or one of the other docs that has an opening.

## 2020-02-03 NOTE — Telephone Encounter (Signed)
Patient advised of all information this morning per Dr. Nehemiah Massed.

## 2020-02-08 ENCOUNTER — Other Ambulatory Visit: Payer: Self-pay

## 2020-02-08 ENCOUNTER — Ambulatory Visit (INDEPENDENT_AMBULATORY_CARE_PROVIDER_SITE_OTHER): Payer: BC Managed Care – PPO | Admitting: Dermatology

## 2020-02-08 DIAGNOSIS — L501 Idiopathic urticaria: Secondary | ICD-10-CM

## 2020-02-08 NOTE — Progress Notes (Signed)
   Follow-Up Visit   Subjective  Julie Skinner is a 44 y.o. female who presents for the following: itching, irritation, and scratching. Occurring from the scalp to the buttocks x 1 week, but no visible rash. Patient tried Benadryl for a week, but didn't not notice an improved. Allegra 180 mg po QD was recommended, but patient was already using Benadryl (which didn't make her drowsy) so she didn't try the [place]. She has also tried topical Sarna and OTC HC cream which didn't help.   The following portions of the chart were reviewed this encounter and updated as appropriate:  Tobacco  Allergies  Meds  Problems  Med Hx  Surg Hx  Fam Hx     Review of Systems:  No other skin or systemic complaints except as noted in HPI or Assessment and Plan.  Objective  Well appearing patient in no apparent distress; mood and affect are within normal limits.  A focused examination was performed including the trunk and extremities. Relevant physical exam findings are noted in the Assessment and Plan.  Objective  trunk and extremities: Pinkness of the post auricular areas and anterior neck.  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   D/C Benadryl. Start Allegra 180mg  po QD x 3 days. If still itching increase to BID. If still no improvement in condition by 1 week patient to contact our office and we will send in Singulair 10mg  po QD, and if still no improvement after 2 weeks consider systemic Prednisone.  Consider screening labs and other testing if necessary in the future.  Not recommended at this time.  Return for TBSE, appointment as scheduled.  Luther Redo, CMA, am acting as scribe for Sarina Ser, MD .  Documentation: I have reviewed the above documentation for accuracy and  completeness, and I agree with the above.  Sarina Ser, MD

## 2020-02-08 NOTE — Patient Instructions (Signed)
Start Allegra 180mg  once daily. If itching not improving after three days then increase to twice per day. If not improving after one week contact our office and we will send in Singulair. If not improved after two weeks consider systemic steroid treatment. Please contact our office this Thursday (02/11/20) or next Monday (02/15/20) to report on condition.

## 2020-02-09 ENCOUNTER — Encounter: Payer: Self-pay | Admitting: Dermatology

## 2020-02-23 ENCOUNTER — Ambulatory Visit
Admission: RE | Admit: 2020-02-23 | Discharge: 2020-02-23 | Disposition: A | Discharge status: Home / Self Care | Payer: BC Managed Care – PPO | Source: Ambulatory Visit | Attending: Certified Nurse Midwife | Admitting: Certified Nurse Midwife

## 2020-02-23 ENCOUNTER — Other Ambulatory Visit: Payer: Self-pay

## 2020-02-23 DIAGNOSIS — N6489 Other specified disorders of breast: Secondary | ICD-10-CM | POA: Insufficient documentation

## 2020-08-05 IMAGING — US US BREAST*R* LIMITED INC AXILLA
1 series · 5 of 5 positions shown · non-contrast
Comparison: Previous exam(s).

CLINICAL DATA: The patient was called back for 2 left breast
asymmetries and a right breast mass.

EXAM:
DIGITAL DIAGNOSTIC BILATERAL MAMMOGRAM WITH CAD AND TOMO
ULTRASOUND BILATERAL BREAST

[Series 1: us breast*right* limited inc axilla · 0.06mm/px · 5 of 5 slices shown]
[im 1/5]
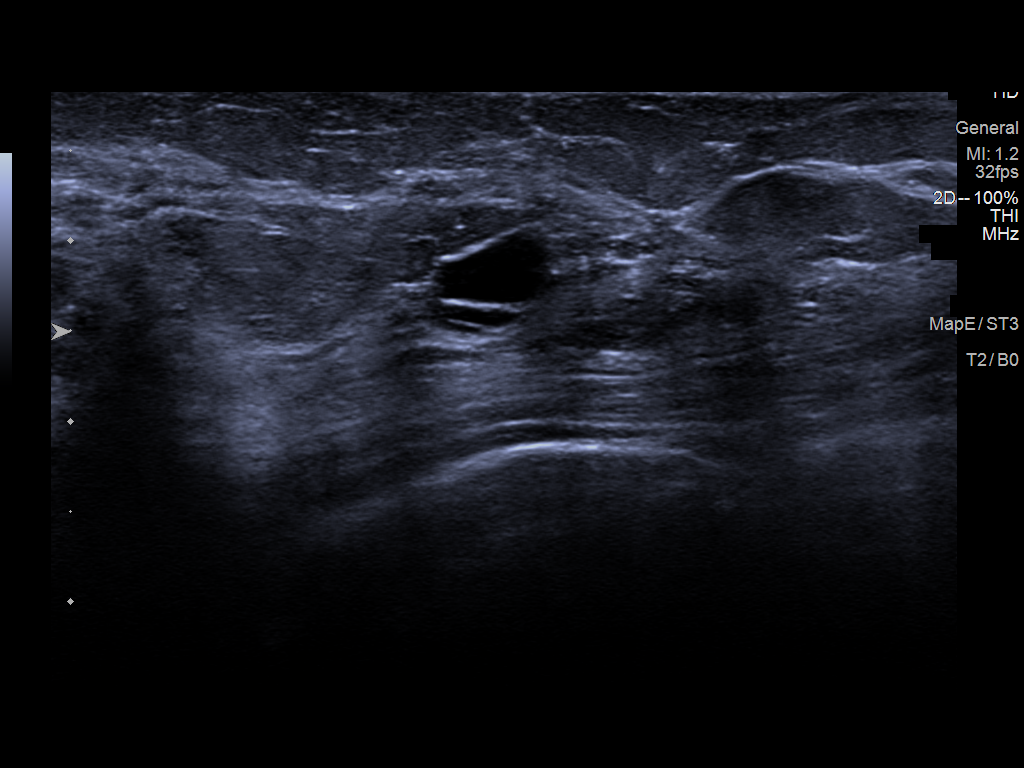
[im 2/5]
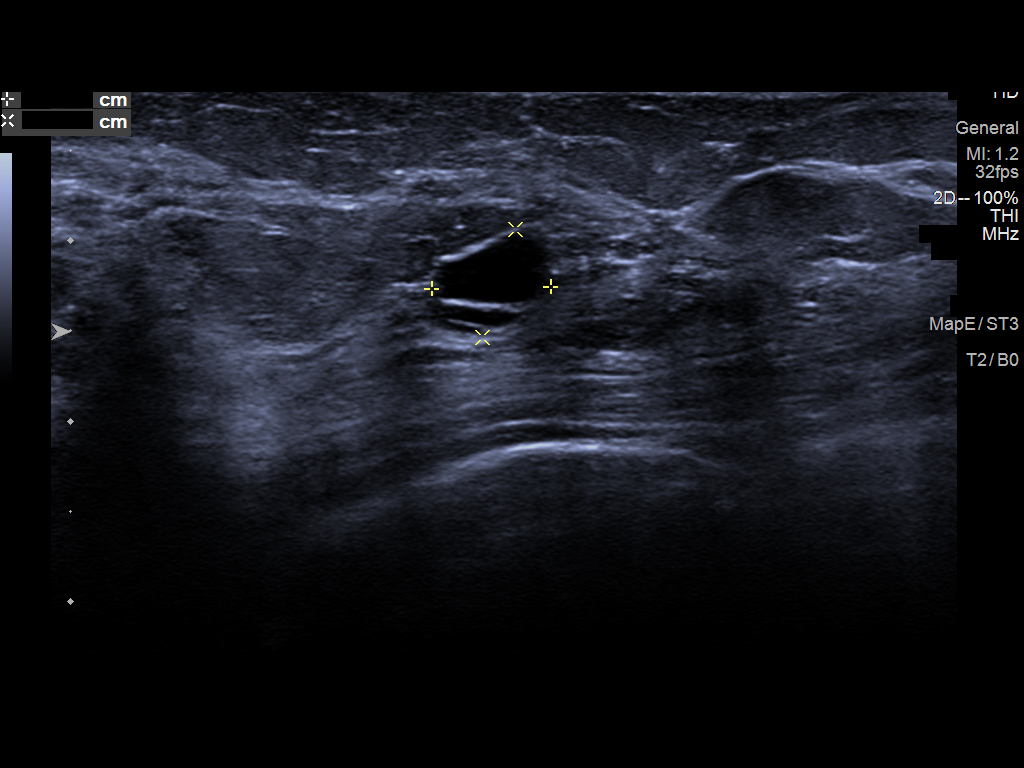
[im 3/5]
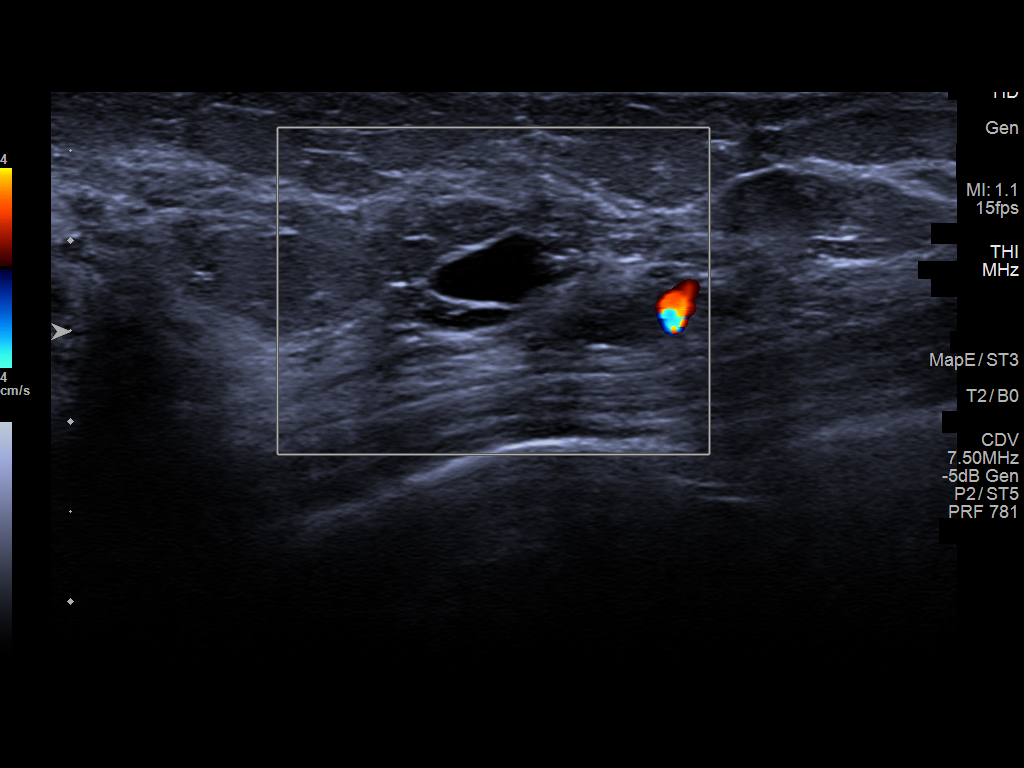
[im 4/5]
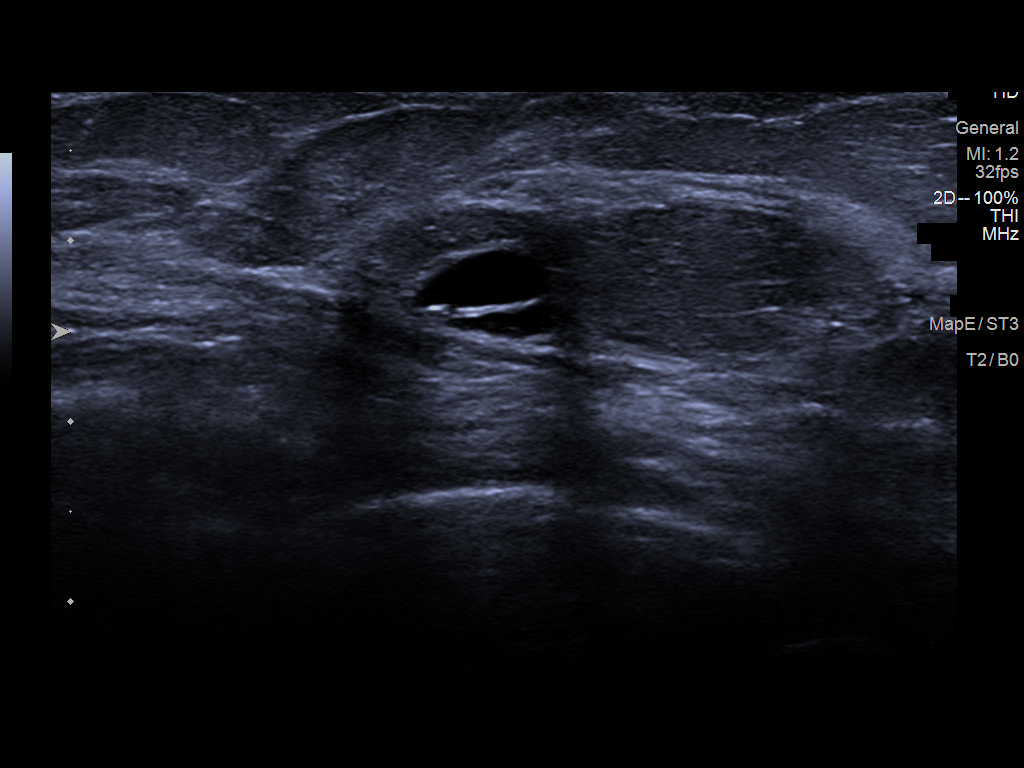
[im 5/5]
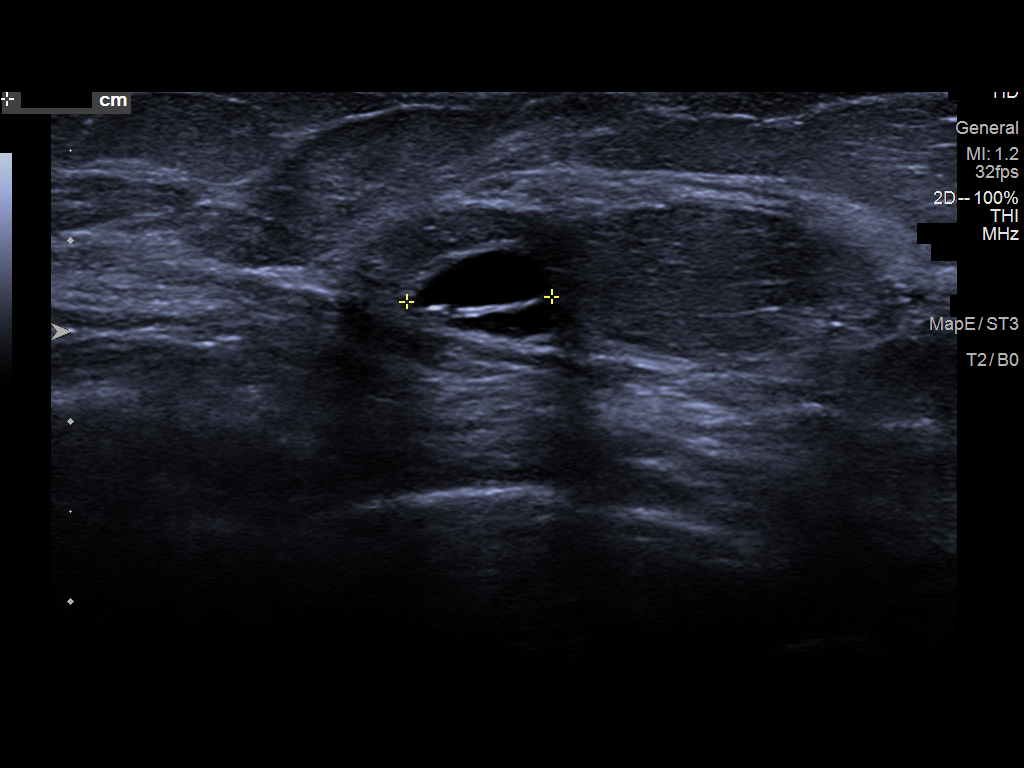

[5 of 5 positions shown; findings below may reference images not displayed]

ACR Breast Density Category b: There are scattered areas of
fibroglandular density.
FINDINGS: The mass in the lateral right breast persists on additional imaging.
The 2 asymmetries significantly improve on spot imaging and appear
to represent glandular tissue with interspersed fat. It is difficult
to compare to studies prior to 9582 due to the lack of 3D imaging.

Mammographic images were processed with CAD.

On physical exam, no suspicious lumps are identified.

Targeted ultrasound is performed, showing a septated cyst in the
right breast at 8 o'clock, 7 cm from the nipple measuring 7 mm,
accounting for the mammographic finding on the right.

No sonographic correlate to the left-sided asymmetry is identified.
Scattered cysts are noted. There is an incidental mass in the left
breast at 5 o'clock, 6 cm from the nipple measuring 5 by 5 x 4 mm.
This mass is irregular and indistinct. No axillary adenopathy.
IMPRESSION: There is an indeterminate incidental mass in the left breast at 5
o'clock, 6 cm from the nipple. The right breast masses the cysts.
The 2 asymmetries may simply represent difference in technique. The
asymmetries have the appearance of glandular tissue on today's spot
imaging. The increased conspicuity at the time of screening may
simply be technical in nature due to difference in technique
compared to previous studies.

RECOMMENDATION:
Recommend ultrasound-guided biopsy of the 5 o'clock left breast
mass. If the biopsy is positive, recommend breast MRI to further
assess the asymmetries in the left breast prior to surgery. One or
both of the asymmetries may require biopsy if the patient is going
to surgery for breast cancer based on the left-sided biopsy. If the
left-sided biopsy is benign, recommend six-month follow-up
mammography of the left breast to follow the asymmetries.

I have discussed the findings and recommendations with the patient.
If applicable, a reminder letter will be sent to the patient
regarding the next appointment.

BI-RADS CATEGORY  4: Suspicious.

## 2020-08-14 IMAGING — MG MM BREAST LOCALIZATION CLIP
4 series · 5 of 12 positions shown · non-contrast
Comparison: Previous exam(s).
COMPARISON: Previous exam(s).

Addendum:
CLINICAL DATA: 43-year-old female for tissue sampling for 0.5 cm
LOWER OUTER LEFT breast mass

EXAM:
ULTRASOUND GUIDED LEFT BREAST CORE NEEDLE BIOPSY
LEFT 3D MAMMOGRAM POST ULTRASOUND BIOPSY

[L ML synth-2D]
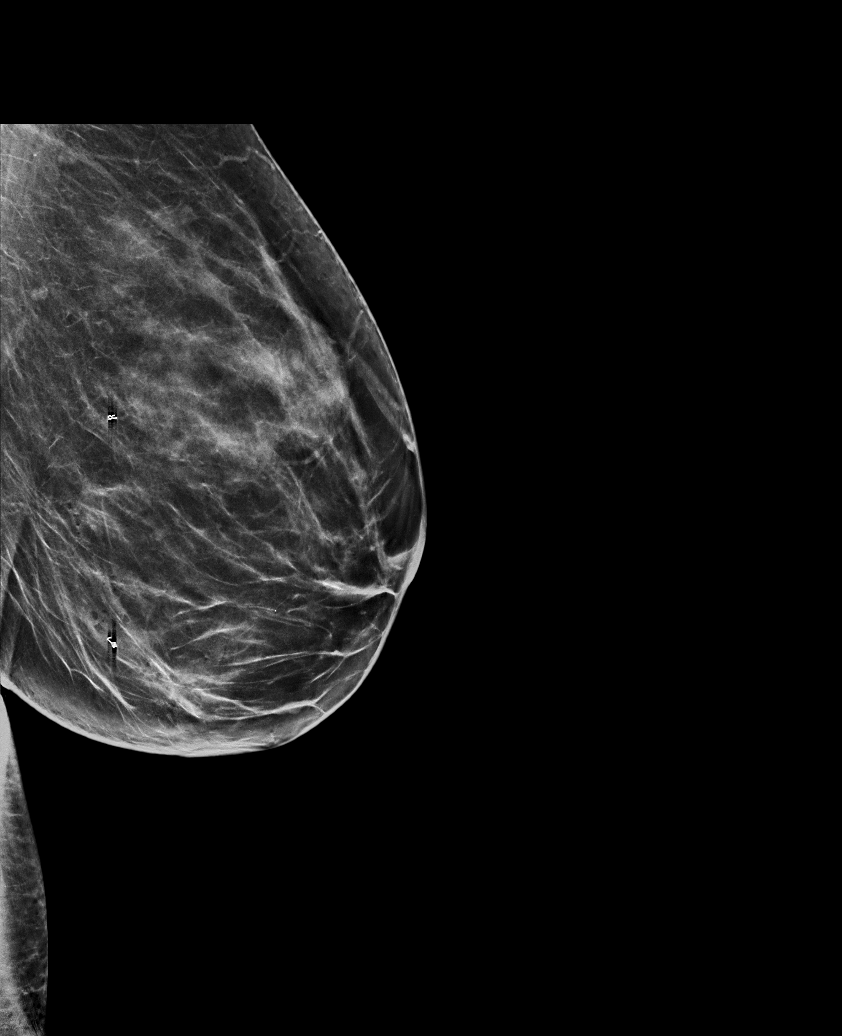

[L CC synth-2D]
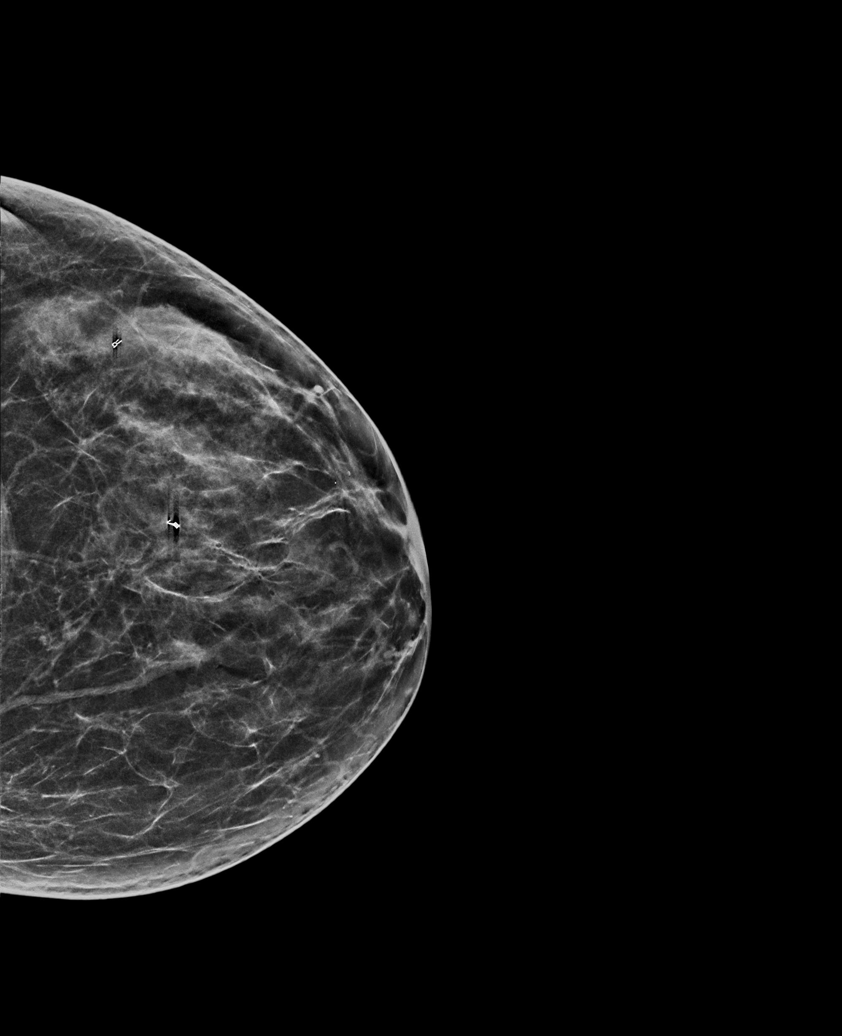

[L ML tomo · 2 of 72 frames shown]
[frame 24/72]
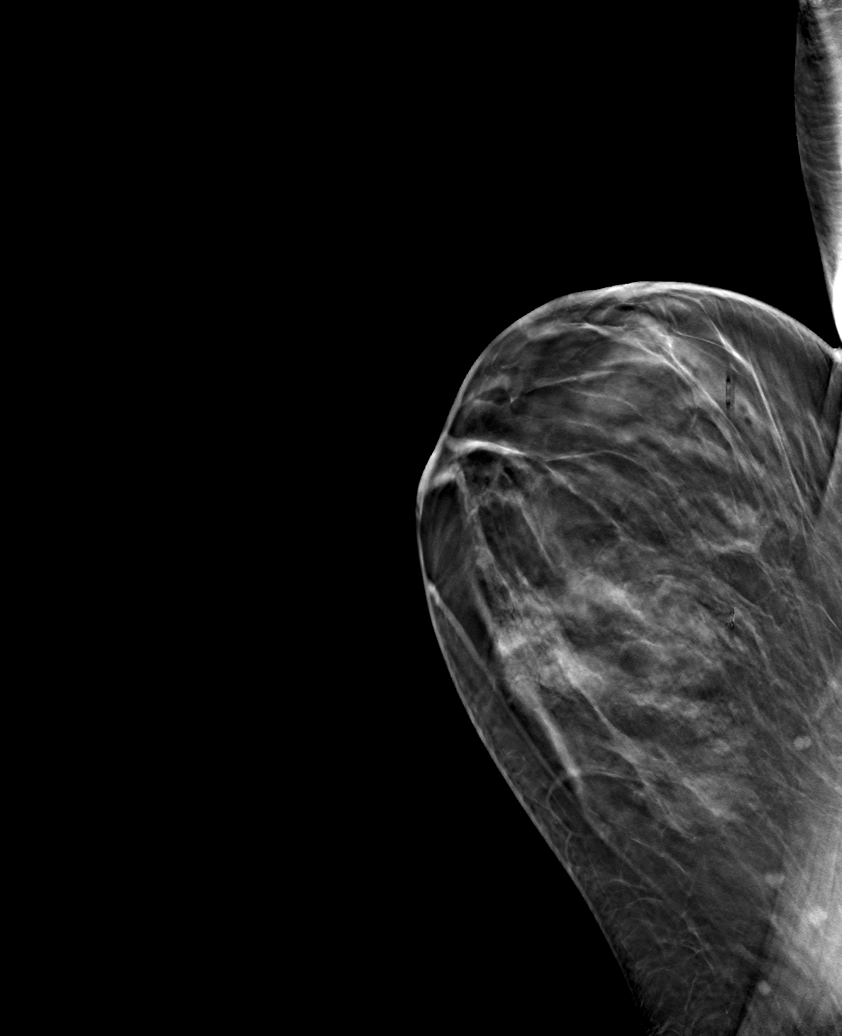
[frame 37/72]
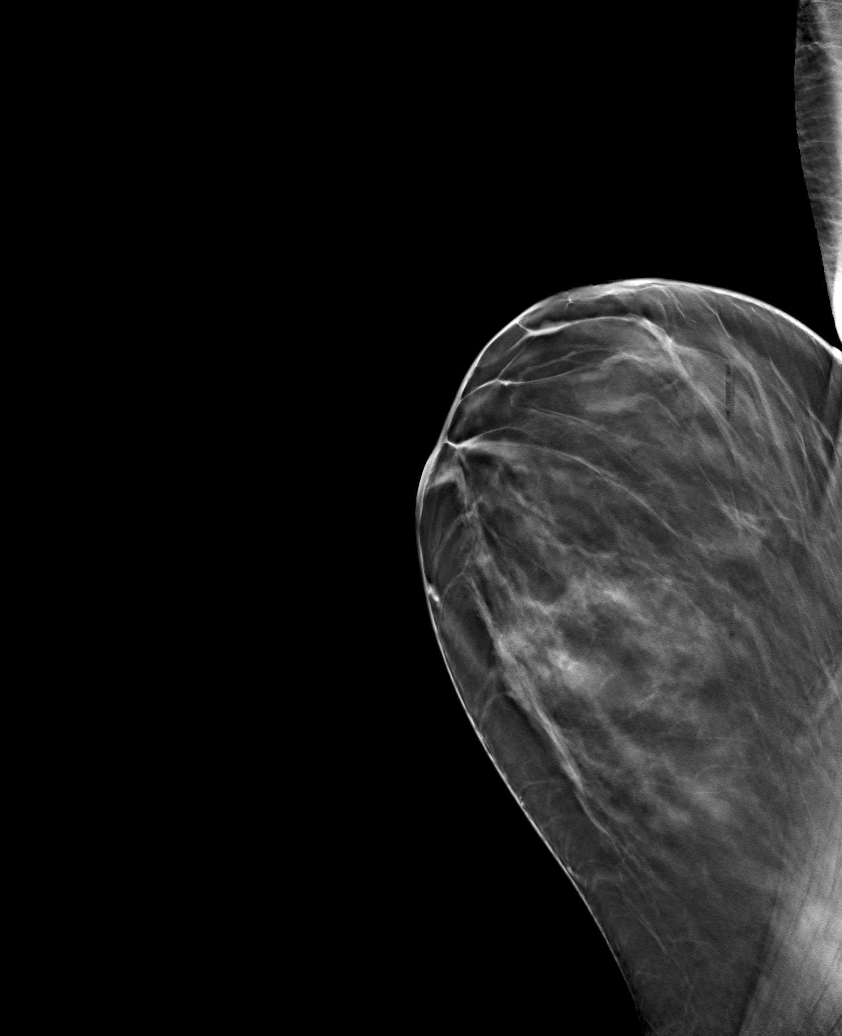

[L CC tomo · tomo slice 32/63.0]
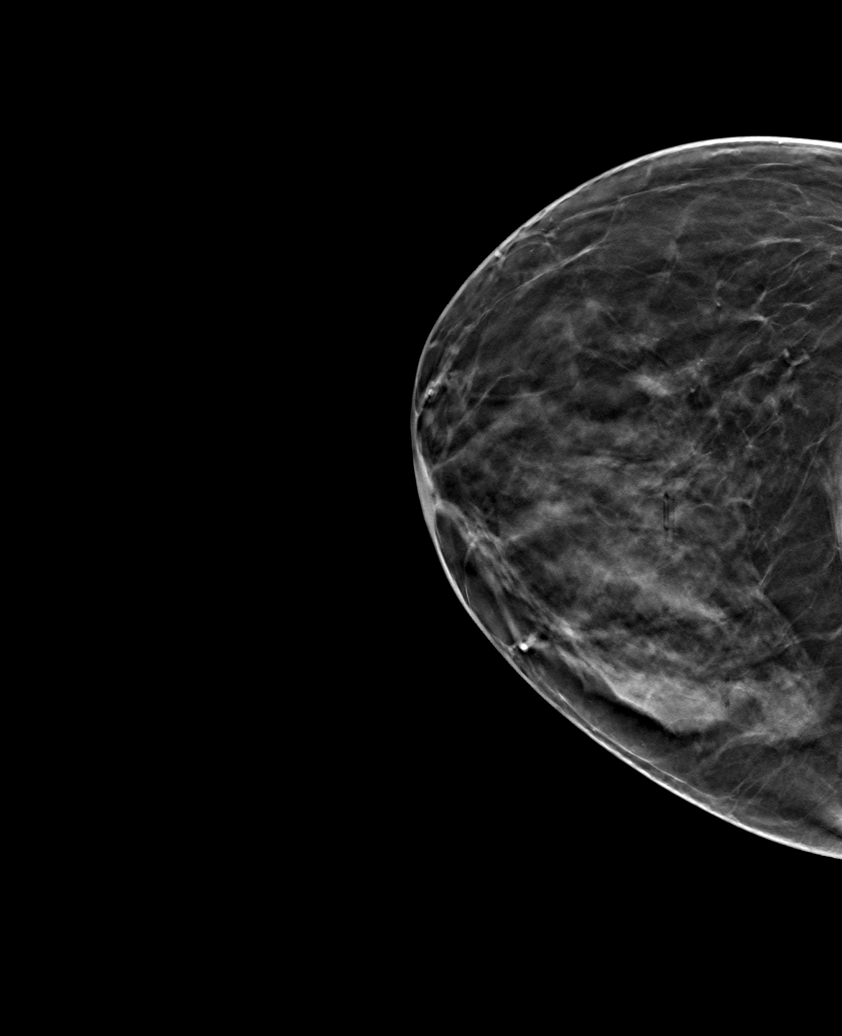

[5 of 12 positions shown; findings below may reference images not displayed]



Lesion quadrant: LOWER OUTER LEFT breast

Using sterile technique and 1% Lidocaine as local anesthetic, under
direct ultrasound visualization, a 12 gauge Ericst device was
used to perform biopsy of the 0.5 cm hypoechoic mass at the 5
o'clock position of the LEFT breast 6 cm from the nipple using a
MEDIAL approach. At the conclusion of the procedure COIL tissue
marker clip was deployed into the biopsy cavity.

Follow up 2 view mammogram was performed demonstrating the COIL clip
to be satisfactory position at the expected site of biopsy
corresponding to the LOWER LEFT breast asymmetry identified on the
screening study. Please note that there is a biopsy clip within the
UPPER OUTER LEFT breast from previous biopsy.
IMPRESSION: Ultrasound guided biopsy of 0.5 cm LOWER OUTER LEFT breast mass. No
apparent complications.

Satisfactory position of COIL clip following ultrasound-guided LEFT
breast biopsy.

ADDENDUM:
Pathology revealed BENIGN BREAST TISSUE WITH AREAS OF STROMAL
FIBROSIS AND FOCAL PSEUDOANGIOMATOUS STROMAL HYPERPLASIA of the LEFT
breast, [DATE] 6 CM FN. This was found to be concordant by Dr. Ronke
[place].

Pathology results were discussed with the patient by telephone. The
patient reported doing well after the biopsy with tenderness at the
site. Post biopsy instructions and care were reviewed and questions
were answered. The patient was encouraged to call The [HOSPITAL]

The patient was asked to return for LEFT diagnostic mammography and
possible ultrasound in 6 months to follow this and other LEFT breast
asymmetries and informed a reminder notice would be sent regarding
this appointment.

Pathology results reported by Algarve Mote, RN on 03/16/2019.



Lesion quadrant: LOWER OUTER LEFT breast

Using sterile technique and 1% Lidocaine as local anesthetic, under
direct ultrasound visualization, a 12 gauge Ericst device was
used to perform biopsy of the 0.5 cm hypoechoic mass at the 5
o'clock position of the LEFT breast 6 cm from the nipple using a
MEDIAL approach. At the conclusion of the procedure COIL tissue
marker clip was deployed into the biopsy cavity.

Follow up 2 view mammogram was performed demonstrating the COIL clip
to be satisfactory position at the expected site of biopsy
corresponding to the LOWER LEFT breast asymmetry identified on the
screening study. Please note that there is a biopsy clip within the
UPPER OUTER LEFT breast from previous biopsy.
IMPRESSION: Ultrasound guided biopsy of 0.5 cm LOWER OUTER LEFT breast mass. No
apparent complications.

Satisfactory position of COIL clip following ultrasound-guided LEFT
breast biopsy.

## 2020-08-15 ENCOUNTER — Other Ambulatory Visit: Payer: Self-pay | Admitting: Certified Nurse Midwife

## 2020-09-05 ENCOUNTER — Telehealth: Payer: Self-pay | Admitting: Certified Nurse Midwife

## 2020-09-05 NOTE — Telephone Encounter (Signed)
Julie Skinner called in and states she would like to get her daughter seen for weight loss.  Her daughter would be a new patient.  Her daughter, [name], has been seeing doctors at [facility] and mom wants to get her seen a little closer to home.  Patient asked me to have a nurse call her because she had some questions about scheduling her daughter.  Her daughter is currently using Phentermine that was prescribed. Mom would like a call back please at [phone].

## 2020-09-05 NOTE — Telephone Encounter (Signed)
Please advise. Thanks Avangeline Stockburger 

## 2020-09-07 NOTE — Telephone Encounter (Signed)
Pt aware that AT will not refill adipex for a 45 y/o. We will be glad to see her for a gyn issue but not wt lost.   Pt voiced understanding.

## 2020-11-02 ENCOUNTER — Encounter: Payer: BC Managed Care – PPO | Admitting: Dermatology

## 2020-11-21 ENCOUNTER — Ambulatory Visit (INDEPENDENT_AMBULATORY_CARE_PROVIDER_SITE_OTHER): Payer: BC Managed Care – PPO | Admitting: Dermatology

## 2020-11-21 ENCOUNTER — Other Ambulatory Visit: Payer: Self-pay

## 2020-11-21 DIAGNOSIS — Z1283 Encounter for screening for malignant neoplasm of skin: Secondary | ICD-10-CM

## 2020-11-21 DIAGNOSIS — L814 Other melanin hyperpigmentation: Secondary | ICD-10-CM

## 2020-11-21 DIAGNOSIS — D239 Other benign neoplasm of skin, unspecified: Secondary | ICD-10-CM

## 2020-11-21 DIAGNOSIS — D2272 Melanocytic nevi of left lower limb, including hip: Secondary | ICD-10-CM

## 2020-11-21 DIAGNOSIS — D225 Melanocytic nevi of trunk: Secondary | ICD-10-CM

## 2020-11-21 DIAGNOSIS — D485 Neoplasm of uncertain behavior of skin: Secondary | ICD-10-CM

## 2020-11-21 DIAGNOSIS — Z85828 Personal history of other malignant neoplasm of skin: Secondary | ICD-10-CM

## 2020-11-21 DIAGNOSIS — L578 Other skin changes due to chronic exposure to nonionizing radiation: Secondary | ICD-10-CM

## 2020-11-21 DIAGNOSIS — Z86018 Personal history of other benign neoplasm: Secondary | ICD-10-CM | POA: Diagnosis not present

## 2020-11-21 DIAGNOSIS — L821 Other seborrheic keratosis: Secondary | ICD-10-CM

## 2020-11-21 DIAGNOSIS — D1800 Hemangioma unspecified site: Secondary | ICD-10-CM

## 2020-11-21 DIAGNOSIS — B360 Pityriasis versicolor: Secondary | ICD-10-CM

## 2020-11-21 DIAGNOSIS — L858 Other specified epidermal thickening: Secondary | ICD-10-CM

## 2020-11-21 DIAGNOSIS — D229 Melanocytic nevi, unspecified: Secondary | ICD-10-CM

## 2020-11-21 HISTORY — DX: Other benign neoplasm of skin, unspecified: D23.9

## 2020-11-21 NOTE — Patient Instructions (Signed)
If you have any questions or concerns for your doctor, please call our main line at [phone] and press option 4 to reach your doctor's medical assistant. If no one answers, please leave a voicemail as directed and we will return your call as soon as possible. Messages left after 4 pm will be answered the following business day.   You may also send us a message via MyChart. We typically respond to MyChart messages within 1-2 business days.  For prescription refills, please ask your pharmacy to contact our office. Our fax number is [phone].  If you have an urgent issue when the clinic is closed that cannot wait until the next business day, you can page your doctor at the number below.    Please note that while we do our best to be available for urgent issues outside of office hours, we are not available 24/7.   If you have an urgent issue and are unable to reach us, you may choose to seek medical care at your doctor's office, retail clinic, urgent care center, or emergency room.  If you have a medical emergency, please immediately call 911 or go to the emergency department.  Pager Numbers  - Dr. Kowalski: [phone]  - Dr. Moye: [phone]  - Dr. Stewart: [phone]  In the event of inclement weather, please call our main line at [phone] for an update on the status of any delays or closures.  Dermatology Medication Tips: Please keep the boxes that topical medications come in in order to help keep track of the instructions about where and how to use these. Pharmacies typically print the medication instructions only on the boxes and not directly on the medication tubes.   If your medication is too expensive, please contact our office at [phone] option 4 or send us a message through MyChart.   We are unable to tell what your co-pay for medications will be in advance as this is different depending on your insurance coverage. However, we may be able to find a substitute  medication at lower cost or fill out paperwork to get insurance to cover a needed medication.   If a prior authorization is required to get your medication covered by your insurance company, please allow us 1-2 business days to complete this process.  Drug prices often vary depending on where the prescription is filled and some pharmacies may offer cheaper prices.  The website www.goodrx.com contains coupons for medications through different pharmacies. The prices here do not account for what the cost may be with help from insurance (it may be cheaper with your insurance), but the website can give you the price if you did not use any insurance.  - You can print the associated coupon and take it with your prescription to the pharmacy.  - You may also stop by our office during regular business hours and pick up a GoodRx coupon card.  - If you need your prescription sent electronically to a different pharmacy, notify our office through [facility] MyChart or by phone at [phone] option 4.   Wound Care Instructions  Cleanse wound gently with soap and water once a day then pat dry with clean gauze. Apply a thing coat of Petrolatum (petroleum jelly, "Vaseline") over the wound (unless you have an allergy to this). We recommend that you use a new, sterile tube of Vaseline. Do not pick or remove scabs. Do not remove the yellow or white "healing tissue" from the base of the wound.  Cover the   wound with fresh, clean, nonstick gauze and secure with paper tape. You may use Band-Aids in place of gauze and tape if the would is small enough, but would recommend trimming much of the tape off as there is often too much. Sometimes Band-Aids can irritate the skin.  You should call the office for your biopsy report after 1 week if you have not already been contacted.  If you experience any problems, such as abnormal amounts of bleeding, swelling, significant bruising, significant pain, or evidence of infection,  please call the office immediately.  FOR ADULT SURGERY PATIENTS: If you need something for pain relief you may take 1 extra strength Tylenol (acetaminophen) AND 2 Ibuprofen (200mg each) together every 4 hours as needed for pain. (do not take these if you are allergic to them or if you have a reason you should not take them.) Typically, you may only need pain medication for 1 to 3 days.    

## 2020-11-21 NOTE — Progress Notes (Signed)
Follow-Up Visit   Subjective  Julie Skinner is a 45 y.o. female who presents for the following: Annual Exam (Hx BCC, dysplastic nevi - patient has a lesion on her back that is raised and she scratches at). The patient presents for Total-Body Skin Exam (TBSE) for skin cancer screening and mole check.  The following portions of the chart were reviewed this encounter and updated as appropriate:   Tobacco  Allergies  Meds  Problems  Med Hx  Surg Hx  Fam Hx     Review of Systems:  No other skin or systemic complaints except as noted in HPI or Assessment and Plan.  Objective  Well appearing patient in no apparent distress; mood and affect are within normal limits.  A full examination was performed including scalp, head, eyes, ears, nose, lips, neck, chest, axillae, abdomen, back, buttocks, bilateral upper extremities, bilateral lower extremities, hands, feet, fingers, toes, fingernails, and toenails. All findings within normal limits unless otherwise noted below.  Trunk, extremities Clear today.  L med scapula 0.5 cm flesh colored papule  L prox ant thigh 0.4 cm irregular brown macule  B/L arms Tiny follicular keratotic papules.   Assessment & Plan  Tinea versicolor Trunk, extremities Chronic persistent and recurrent Continue Ketoconazole 2% shampoo 3d/qk.   Neoplasm of uncertain behavior of skin (2) L med scapula  Epidermal / dermal shaving  Lesion diameter (cm):  0.5 Informed consent: discussed and consent obtained   Timeout: patient name, date of birth, surgical site, and procedure verified   Procedure prep:  Patient was prepped and draped in usual sterile fashion Prep type:  Isopropyl alcohol Anesthesia: the lesion was anesthetized in a standard fashion   Anesthetic:  1% lidocaine w/ epinephrine 1-100,000 buffered w/ 8.4% NaHCO3 Instrument used: flexible razor blade   Hemostasis achieved with: pressure, aluminum chloride and electrodesiccation   Outcome:  patient tolerated procedure well   Post-procedure details: sterile dressing applied and wound care instructions given   Dressing type: bandage and petrolatum    Specimen 1 - Surgical pathology Differential Diagnosis: D48.5 irritated nevus r/o dysplastic nevus Check Margins: No 0.5 cm flesh colored papule  L prox ant thigh  Epidermal / dermal shaving  Lesion diameter (cm):  0.4 Informed consent: discussed and consent obtained   Timeout: patient name, date of birth, surgical site, and procedure verified   Procedure prep:  Patient was prepped and draped in usual sterile fashion Prep type:  Isopropyl alcohol Anesthesia: the lesion was anesthetized in a standard fashion   Anesthetic:  1% lidocaine w/ epinephrine 1-100,000 buffered w/ 8.4% NaHCO3 Instrument used: flexible razor blade   Hemostasis achieved with: pressure, aluminum chloride and electrodesiccation   Outcome: patient tolerated procedure well   Post-procedure details: sterile dressing applied and wound care instructions given   Dressing type: bandage and petrolatum    Specimen 2 - Surgical pathology Differential Diagnosis: D48.5 r/o dysplastic nevus  Check Margins: No 0.4 cm irregular brown macule  Keratosis pilaris B/L arms Chronic and persistent.  May use light AmLactin rapid relief cream.  Treatable but not curable. Benign-appearing.  Observation.  Call clinic for new or changing lesions.  Recommend daily use of broad spectrum spf 30+ sunscreen to sun-exposed areas.   Skin cancer screening  Lentigines - Scattered tan macules - Due to sun exposure - Benign-appering, observe - Recommend daily broad spectrum sunscreen SPF 30+ to sun-exposed areas, reapply every 2 hours as needed. - Call for any changes  Seborrheic Keratoses - Stuck-on,  waxy, tan-brown papules and/or plaques  - Benign-appearing - Discussed benign etiology and prognosis. - Observe - Call for any changes  Melanocytic Nevi - Tan-brown and/or  pink-flesh-colored symmetric macules and papules - Benign appearing on exam today - Observation - Call clinic for new or changing moles - Recommend daily use of broad spectrum spf 30+ sunscreen to sun-exposed areas.   Hemangiomas - Red papules - Discussed benign nature - Observe - Call for any changes  Actinic Damage - Chronic condition, secondary to cumulative UV/sun exposure - diffuse scaly erythematous macules with underlying dyspigmentation - Recommend daily broad spectrum sunscreen SPF 30+ to sun-exposed areas, reapply every 2 hours as needed.  - Staying in the shade or wearing long sleeves, sun glasses (UVA+UVB protection) and wide brim hats (4-inch brim around the entire circumference of the hat) are also recommended for sun protection.  - Call for new or changing lesions.  History of Basal Cell Carcinoma of the Skin - No evidence of recurrence today - Recommend regular full body skin exams - Recommend daily broad spectrum sunscreen SPF 30+ to sun-exposed areas, reapply every 2 hours as needed.  - Call if any new or changing lesions are noted between office visits  History of Dysplastic Nevi - No evidence of recurrence today - Recommend regular full body skin exams - Recommend daily broad spectrum sunscreen SPF 30+ to sun-exposed areas, reapply every 2 hours as needed.  - Call if any new or changing lesions are noted between office visits  Skin cancer screening performed today.  Return in about 1 year (around 11/21/2021) for TBSE.  Luther Redo, CMA, am acting as scribe for Sarina Ser, MD . Documentation: I have reviewed the above documentation for accuracy and completeness, and I agree with the above.  Sarina Ser, MD

## 2020-11-23 ENCOUNTER — Encounter: Payer: Self-pay | Admitting: Dermatology

## 2020-12-06 ENCOUNTER — Telehealth: Payer: Self-pay

## 2020-12-06 ENCOUNTER — Other Ambulatory Visit: Payer: Self-pay

## 2020-12-06 DIAGNOSIS — B360 Pityriasis versicolor: Secondary | ICD-10-CM

## 2020-12-06 MED ORDER — KETOCONAZOLE 2 % EX SHAM: MEDICATED_SHAMPOO | CUTANEOUS | 6 refills | Status: DC

## 2020-12-06 NOTE — Telephone Encounter (Signed)
Patient informed of pathology results 

## 2020-12-06 NOTE — Telephone Encounter (Signed)
-----   Message from Ralene Bathe, MD sent at 12/01/2020 12:47 PM EDT ----- Diagnosis 1. Skin , L med scapula MELANOCYTIC NEVUS WITH SCAR, SEE DESCRIPTION 2. Skin , L prox ant thigh DYSPLASTIC COMPOUND NEVUS WITH MILD ATYPIA, DEEP MARGIN INVOLVED  1- benign mole No further treatment needed 2- dysplastic Mild Recheck next visit - keep 1 year fu appt

## 2020-12-06 NOTE — Progress Notes (Signed)
Ketoconazole RF to pharmacy with directions.

## 2020-12-14 ENCOUNTER — Ambulatory Visit (INDEPENDENT_AMBULATORY_CARE_PROVIDER_SITE_OTHER): Payer: BC Managed Care – PPO | Admitting: Certified Nurse Midwife

## 2020-12-14 ENCOUNTER — Other Ambulatory Visit: Payer: Self-pay

## 2020-12-14 ENCOUNTER — Encounter: Payer: Self-pay | Admitting: Certified Nurse Midwife

## 2020-12-14 VITALS — BP 130/79 | HR 69 | Ht 64.0 in | Wt 147.9 lb

## 2020-12-14 DIAGNOSIS — Z1231 Encounter for screening mammogram for malignant neoplasm of breast: Secondary | ICD-10-CM

## 2020-12-14 DIAGNOSIS — Z23 Encounter for immunization: Secondary | ICD-10-CM | POA: Diagnosis not present

## 2020-12-14 DIAGNOSIS — Z01419 Encounter for gynecological examination (general) (routine) without abnormal findings: Secondary | ICD-10-CM

## 2020-12-14 MED ORDER — TETANUS-DIPHTH-ACELL PERTUSSIS 5-2.5-18.5 LF-MCG/0.5 IM SUSY
0.5000 mL | PREFILLED_SYRINGE | Freq: Once | INTRAMUSCULAR | Status: AC
  Administered 2020-12-14: 0.5 mL via INTRAMUSCULAR

## 2020-12-14 NOTE — Progress Notes (Signed)
GYNECOLOGY ANNUAL PREVENTATIVE CARE ENCOUNTER NOTE  History:     Julie Skinner is a 45 y.o. [phone] female here for a routine annual gynecologic exam.  Current complaints: none.   Denies abnormal vaginal bleeding, discharge, pelvic pain, problems with intercourse or other gynecologic concerns.     Social Relationship: Married  Living: Spouse and 2 daughter ( in [employer]) Work: Press photographer Exercise: walking 3-4 times week Smoke/Alcohol/drug use: denies use  Gynecologic History No LMP recorded. (Menstrual status: IUD). Contraception: IUD Last Pap: 12/05/2019. Results were: normal with negative HPV Last mammogram: 02/23/2020. Results were: abnormalThe mass in the left breast at 5 o'clock at the site of biopsy proven stromal fibrosis and PASH is stable.  Obstetric History OB History  Gravida Para Term Preterm AB Living  '2 2 2     2  '$ SAB IAB Ectopic Multiple Live Births          2    # Outcome Date GA Lbr Len/2nd Weight Sex Delivery Anes PTL Lv  2 Term 06/29/07   7 lb (3.175 kg) M CS-Unspec  N LIV  1 Term 01/12/05   8 lb (3.629 kg) F CS-Unspec  N LIV     Complications: Breech extraction    Obstetric Comments  1st Menstrual Cycle: 10  1st Pregnancy:  29    Past Medical History:  Diagnosis Date   Dysplastic nevus 11/21/2020   L prox ant thigh - mild   Hx of basal cell carcinoma 12/30/2014   L lateral thigh. Superficial   Hx of dysplastic nevus 07/04/2017   R lat knee. Moderate to severe atypia, lateral margin involved. Excised 10/08/2017, margins free.   Left breast lump    Myasthenia gravis ([place]) 2007   Myasthenia gravis ([place])    Vaginal Pap smear, abnormal     Past Surgical History:  Procedure Laterality Date   BREAST BIOPSY Left 2015?   benign, with marker, at Dr. Dwyane Luo office   BREAST EXCISIONAL BIOPSY Left [place] Left 1999   lump removed   CESAREAN SECTION  2009,2006   THYMECTOMY  2007   thymetomy  2007    Current  Outpatient Medications on File Prior to Visit  Medication Sig Dispense Refill   cholecalciferol (VITAMIN D) 1000 UNITS tablet Take 1,000 Units by mouth as needed. Reported on 05/04/2015     gabapentin (NEURONTIN) 600 MG tablet Take 2 tablets at bedtime     ketoconazole (NIZORAL) 2 % shampoo Shampoo into scalp and body let sit 10 minutes then wash off up to three days per week 120 mL 6   levonorgestrel (MIRENA) 20 MCG/24HR IUD 1 each by Intrauterine route once.     losartan (COZAAR) 50 MG tablet TAKE 1 TABLET(50 MG) BY MOUTH DAILY 30 tablet 6   traMADol (ULTRAM) 50 MG tablet Take 50 mg by mouth every 6 (six) hours as needed. Reported on 09/01/2015     Current Facility-Administered Medications on File Prior to Visit  Medication Dose Route Frequency Provider Last Rate Last Admin   cyanocobalamin ((VITAMIN B-12)) injection 1,000 mcg  1,000 mcg Intramuscular Once Shambley, Melody N, CNM       cyanocobalamin ((VITAMIN B-12)) injection 1,000 mcg  1,000 mcg Intramuscular Once Shambley, Melody N, CNM        No Known Allergies  Social History:  reports that she has never smoked. She has never used smokeless tobacco. She reports current alcohol use. She reports that  she does not use drugs.  Family History  Problem Relation Age of Onset   Heart disease Mother    Hypertension Mother    Heart disease Father    Hypertension Father    Heart disease Maternal Grandmother    Cancer Maternal Grandfather        breast   Breast cancer Paternal Grandmother 19    The following portions of the patient's history were reviewed and updated as appropriate: allergies, current medications, past family history, past medical history, past social history, past surgical history and problem list.  Review of Systems Pertinent items noted in HPI and remainder of comprehensive ROS otherwise negative.  Physical Exam:  There were no vitals taken for this visit. CONSTITUTIONAL: Well-developed, well-nourished female in no  acute distress.  HENT:  Normocephalic, atraumatic, External right and left ear normal. Oropharynx is clear and moist EYES: Conjunctivae and EOM are normal. Pupils are equal, round, and reactive to light. No scleral icterus.  NECK: Normal range of motion, supple, no masses.  Normal thyroid.  SKIN: Skin is warm and dry. No rash noted. Not diaphoretic. No erythema. No pallor. MUSCULOSKELETAL: Normal range of motion. No tenderness.  No cyanosis, clubbing, or edema.  2+ distal pulses. NEUROLOGIC: Alert and oriented to person, place, and time. Normal reflexes, muscle tone coordination.  PSYCHIATRIC: Normal mood and affect. Normal behavior. Normal judgment and thought content. CARDIOVASCULAR: Normal heart rate noted, regular rhythm RESPIRATORY: Clear to auscultation bilaterally. Effort and breath sounds normal, no problems with respiration noted. BREASTS: Symmetric in size. No masses, tenderness, skin changes, nipple drainage, or lymphadenopathy bilaterally.  ABDOMEN: Soft, no distention noted.  No tenderness, rebound or guarding.  PELVIC: Normal appearing external genitalia and urethral meatus; normal appearing vaginal mucosa and cervix.  No abnormal discharge noted.  Pap smear not due. Strings present  Normal uterine size, no other palpable masses, no uterine or adnexal tenderness.  .   Assessment and Plan:    1. Women's annual routine gynecological examination    Pap: not due Mammogram : ordered Labs: none Refills/orders:Tdap Referral: none Routine preventative health maintenance measures emphasized. Please refer to After Visit Summary for other counseling recommendations.      Philip Aspen, CNM [facility]

## 2020-12-20 ENCOUNTER — Telehealth: Payer: Self-pay | Admitting: Certified Nurse Midwife

## 2020-12-20 NOTE — Telephone Encounter (Signed)
Patient called in and states she needs her mammogram order to be changed to Diagnostic Breast TOMO Bilateral.  [place] refused to make her appointment until the correct order was placed.   Please advise.

## 2020-12-21 ENCOUNTER — Other Ambulatory Visit: Payer: Self-pay | Admitting: Certified Nurse Midwife

## 2020-12-21 MED ORDER — CYCLOBENZAPRINE HCL 5 MG PO TABS: 5.0000 mg | ORAL_TABLET | Freq: Three times a day (TID) | ORAL | 0 refills | Status: DC

## 2020-12-22 ENCOUNTER — Other Ambulatory Visit: Payer: Self-pay

## 2020-12-22 DIAGNOSIS — Z1231 Encounter for screening mammogram for malignant neoplasm of breast: Secondary | ICD-10-CM

## 2020-12-22 NOTE — Telephone Encounter (Signed)
LVM with patient that order was placed.

## 2021-01-26 ENCOUNTER — Telehealth: Payer: Self-pay | Admitting: Certified Nurse Midwife

## 2021-01-26 NOTE — Telephone Encounter (Signed)
Patient states that she has tried to schedule her mammogram and schedule until the right order has been put in.  She states that she needs a Bilateral diagnostic and a Left breast US

## 2021-01-30 ENCOUNTER — Other Ambulatory Visit: Payer: Self-pay

## 2021-01-30 DIAGNOSIS — Z1231 Encounter for screening mammogram for malignant neoplasm of breast: Secondary | ICD-10-CM

## 2021-01-30 NOTE — Telephone Encounter (Signed)
ERROR

## 2021-02-23 ENCOUNTER — Inpatient Hospital Stay: Admission: RE | Admit: 2021-02-23 | Payer: BC Managed Care – PPO | Source: Ambulatory Visit

## 2021-02-23 ENCOUNTER — Other Ambulatory Visit: Payer: BC Managed Care – PPO

## 2021-03-07 ENCOUNTER — Ambulatory Visit
Admission: RE | Admit: 2021-03-07 | Discharge: 2021-03-07 | Disposition: A | Discharge status: Home / Self Care | Payer: BC Managed Care – PPO | Source: Ambulatory Visit | Attending: Certified Nurse Midwife | Admitting: Certified Nurse Midwife

## 2021-03-07 ENCOUNTER — Other Ambulatory Visit: Payer: Self-pay

## 2021-03-07 DIAGNOSIS — Z1231 Encounter for screening mammogram for malignant neoplasm of breast: Secondary | ICD-10-CM | POA: Diagnosis not present

## 2021-03-07 DIAGNOSIS — R922 Inconclusive mammogram: Secondary | ICD-10-CM | POA: Diagnosis not present

## 2021-05-25 ENCOUNTER — Encounter: Payer: Self-pay | Admitting: Certified Nurse Midwife

## 2021-05-25 ENCOUNTER — Other Ambulatory Visit: Payer: Self-pay | Admitting: Certified Nurse Midwife

## 2021-05-29 ENCOUNTER — Telehealth: Payer: BC Managed Care – PPO | Admitting: Certified Nurse Midwife

## 2021-05-29 ENCOUNTER — Encounter: Payer: Self-pay | Admitting: Certified Nurse Midwife

## 2021-05-29 VITALS — Ht 64.0 in | Wt 152.0 lb

## 2021-05-29 DIAGNOSIS — F320 Major depressive disorder, single episode, mild: Secondary | ICD-10-CM | POA: Diagnosis not present

## 2021-05-29 MED ORDER — SERTRALINE HCL 50 MG PO TABS: 50.0000 mg | ORAL_TABLET | Freq: Every day | ORAL | 3 refills | Status: DC

## 2021-05-29 NOTE — Patient Instructions (Signed)

## 2021-05-29 NOTE — Progress Notes (Signed)
Virtual Visit via Video Note  I connected with Julie Skinner on 05/29/21 at  3:30 PM EST by a video enabled telemedicine application and verified that I am speaking with the correct person using two identifiers.  Location: Patient: at office  Provider: at home   I discussed the limitations of evaluation and management by telemedicine and the availability of in person appointments. The patient expressed understanding and agreed to proceed.  History of Present Illness: Pt states for the past month she has not felt like herself. She has history of having episodes in the past and has used medication in the past to manage. She feels unmotivated and is emotional     Observations/Objective: GAD 7 : Generalized Anxiety Score 05/29/2021  Nervous, Anxious, on Edge 1  Control/stop worrying 1  Worry too much - different things 1  Trouble relaxing 1  Restless 0  Easily annoyed or irritable 1  Afraid - awful might happen 0  Total GAD 7 Score 5  Anxiety Difficulty Somewhat difficult    Flowsheet Row Video Visit from 05/29/2021 in [facility]  PHQ-9 Total Score 10        Assessment and Plan: Discussed treatment options of depression ( moderate ) anxiety mild. Discussed healthy life style ( exercise) . pT state she has been making herself exercise 3-5 times a week . She used to enjoy it but is has been a struggle this past month. She denies desire to hurt herself. Discussed use of medication and counseling. She is in agreement. Discussed use of herbal supplements. Orders placed   Follow Up Instructions: Zoloft ordered. Referral placed for counseling. Patient will follow up 4-6 wks for medication management.      I discussed the assessment and treatment plan with the patient. The patient was provided an opportunity to ask questions and all were answered. The patient agreed with the plan and demonstrated an understanding of the instructions.   The patient was advised to call  back or seek an in-person evaluation if the symptoms worsen or if the condition fails to improve as anticipated.  I provided 10 minutes of non-face-to-face time during this encounter.   Philip Aspen, CNM

## 2021-07-20 ENCOUNTER — Encounter: Payer: Self-pay | Admitting: Certified Nurse Midwife

## 2021-09-19 ENCOUNTER — Other Ambulatory Visit: Payer: Self-pay | Admitting: Certified Nurse Midwife

## 2021-11-22 ENCOUNTER — Ambulatory Visit: Payer: BC Managed Care – PPO | Admitting: Dermatology

## 2021-12-20 ENCOUNTER — Encounter: Payer: Self-pay | Admitting: Certified Nurse Midwife

## 2021-12-20 ENCOUNTER — Ambulatory Visit (INDEPENDENT_AMBULATORY_CARE_PROVIDER_SITE_OTHER): Payer: BC Managed Care – PPO | Admitting: Certified Nurse Midwife

## 2021-12-20 VITALS — BP 115/73 | HR 71 | Ht 64.0 in | Wt 159.2 lb

## 2021-12-20 DIAGNOSIS — Z01419 Encounter for gynecological examination (general) (routine) without abnormal findings: Secondary | ICD-10-CM | POA: Diagnosis not present

## 2021-12-20 DIAGNOSIS — Z1211 Encounter for screening for malignant neoplasm of colon: Secondary | ICD-10-CM

## 2021-12-20 DIAGNOSIS — Z1231 Encounter for screening mammogram for malignant neoplasm of breast: Secondary | ICD-10-CM | POA: Diagnosis not present

## 2021-12-20 NOTE — Progress Notes (Signed)
GYNECOLOGY ANNUAL PREVENTATIVE CARE ENCOUNTER NOTE  History:     Julie Skinner is a 46 y.o. G23P2002 female here for a routine annual gynecologic exam.  Current complaints: shoulder, neck, and back pain from her large breast. States it has been going on for a few years now.   Denies abnormal vaginal bleeding, discharge, pelvic pain, problems with intercourse or other gynecologic concerns.     Social Relationship: Married  Living: spouse and children  Work: Press photographer Exercise: a few times a week  Smoke/Alcohol/drug [id number] use   Gynecologic History No LMP recorded. (Menstrual status: IUD). Contraception: IUD Last Pap: 12/05/2019. Results were: normal with negative HPV Last mammogram: 03/07/2021. Results were: normal   Upstream - 12/20/21 0904       Pregnancy Intention Screening   Does the patient want to become pregnant in the next year? No    Does the patient's partner want to become pregnant in the next year? No    Would the patient like to discuss contraceptive options today? No            The pregnancy intention screening data noted above was reviewed. Potential methods of contraception were discussed. The patient elected to proceed with iud.  Obstetric History OB History  Gravida Para Term Preterm AB Living  '2 2 2     2  '$ SAB IAB Ectopic Multiple Live Births          2    # Outcome Date GA Lbr Len/2nd Weight Sex Delivery Anes PTL Lv  2 Term 06/29/07   7 lb (3.175 kg) M CS-Unspec  N LIV  1 Term 01/12/05   8 lb (3.629 kg) F CS-Unspec  N LIV     Complications: Breech extraction    Obstetric Comments  1st Menstrual Cycle: 10  1st Pregnancy:  29    Past Medical History:  Diagnosis Date   Dysplastic nevus 11/21/2020   L prox ant thigh - mild   Hx of basal cell carcinoma 12/30/2014   L lateral thigh. Superficial   Hx of dysplastic nevus 07/04/2017   R lat knee. Moderate to severe atypia, lateral margin involved. Excised 10/08/2017, margins free.    Left breast lump    Myasthenia gravis ([place]) 2007   Myasthenia gravis ([place])    Vaginal Pap smear, abnormal     Past Surgical History:  Procedure Laterality Date   BREAST BIOPSY Left 2015?   fibroadenoma, with marker, at Dr. Dwyane Luo office   BREAST BIOPSY Left 03/13/2019   [place]   BREAST EXCISIONAL BIOPSY Left 1999   lump removed   BREAST EXCISIONAL BIOPSY Left 1993   BENIGN   CESAREAN SECTION  2009,2006   THYMECTOMY  2007   thymetomy  2007    Current Outpatient Medications on File Prior to Visit  Medication Sig Dispense Refill   ketoconazole (NIZORAL) 2 % shampoo Shampoo into scalp and body let sit 10 minutes then wash off up to three days per week 120 mL 6   levonorgestrel (MIRENA) 20 MCG/24HR IUD 1 each by Intrauterine route once.     losartan (COZAAR) 50 MG tablet TAKE 1 TABLET BY MOUTH DAILY 30 tablet 6   sertraline (ZOLOFT) 50 MG tablet TAKE 1 TABLET(50 MG) BY MOUTH DAILY 30 tablet 3   Current Facility-Administered Medications on File Prior to Visit  Medication Dose Route Frequency Provider Last Rate Last Admin   cyanocobalamin ((VITAMIN B-12)) injection 1,000 mcg  1,000 mcg Intramuscular Once [place],  Melody N, CNM       cyanocobalamin ((VITAMIN B-12)) injection 1,000 mcg  1,000 mcg Intramuscular Once Shambley, Melody N, CNM        No Known Allergies  Social History:  reports that she has never smoked. She has never used smokeless tobacco. She reports current alcohol use. She reports that she does not use drugs.  Family History  Problem Relation Age of Onset   Heart disease Mother    Hypertension Mother    Heart disease Father    Hypertension Father    Heart disease Maternal Grandmother    Cancer Maternal Grandfather        breast   Breast cancer Paternal Grandmother 60    The following portions of the patient's history were reviewed and updated as appropriate: allergies, current medications, past family history, past medical history, past social history, past  surgical history and problem list.  Review of Systems Pertinent items noted in HPI and remainder of comprehensive ROS otherwise negative.  Physical Exam:  BP 115/73   Pulse 71   Ht '5\' 4"'$  (1.626 m)   Wt 159 lb 3.2 oz (72.2 kg)   BMI 27.33 kg/m  CONSTITUTIONAL: Well-developed, well-nourished female in no acute distress.  HENT:  Normocephalic, atraumatic, External right and left ear normal. Oropharynx is clear and moist EYES: Conjunctivae and EOM are normal. Pupils are equal, round, and reactive to light. No scleral icterus.  NECK: Normal range of motion, supple, no masses.  Normal thyroid.  SKIN: Skin is warm and dry. No rash noted. Not diaphoretic. No erythema. No pallor. MUSCULOSKELETAL: Normal range of motion. No tenderness.  No cyanosis, clubbing, or edema.  2+ distal pulses. NEUROLOGIC: Alert and oriented to person, place, and time. Normal reflexes, muscle tone coordination.  PSYCHIATRIC: Normal mood and affect. Normal behavior. Normal judgment and thought content. CARDIOVASCULAR: Normal heart rate noted, regular rhythm RESPIRATORY: Clear to auscultation bilaterally. Effort and breath sounds normal, no problems with respiration noted. BREASTS: Symmetric in size. Large pendulous. Indentation on shoulders from bra.  No masses, tenderness, skin changes, nipple drainage, or lymphadenopathy bilaterally.  ABDOMEN: Soft, no distention noted.  No tenderness, rebound or guarding.  PELVIC: Normal appearing external genitalia and urethral meatus; normal appearing vaginal mucosa and cervix.  No abnormal discharge noted.  Pap smear not due.  Normal uterine size, no other palpable masses, no uterine or adnexal tenderness.  .   Assessment and Plan:    1. Well woman exam with routine gynecological exam   Pap: Not due  Mammogram : ordered Colonoscopy: [place] ordered Labs: none Refills: none Referral: none  Routine preventative health maintenance measures emphasized. Please refer to After  Visit Summary for other counseling recommendations.      Philip Aspen, CNM [facility]

## 2021-12-20 NOTE — Patient Instructions (Signed)
Preventive Care 40-48 Years Old, Female Preventive care refers to lifestyle choices and visits with your health care provider that can promote health and wellness. Preventive care visits are also called wellness exams. What can I expect for my preventive care visit? Counseling Your health care provider may ask you questions about your: Medical history, including: Past medical problems. Family medical history. Pregnancy history. Current health, including: Menstrual cycle. Method of birth control. Emotional well-being. Home life and relationship well-being. Sexual activity and sexual health. Lifestyle, including: Alcohol, nicotine or tobacco, and drug use. Access to firearms. Diet, exercise, and sleep habits. Work and work environment. Sunscreen use. Safety issues such as seatbelt and bike helmet use. Physical exam Your health care provider will check your: Height and weight. These may be used to calculate your BMI (body mass index). BMI is a measurement that tells if you are at a healthy weight. Waist circumference. This measures the distance around your waistline. This measurement also tells if you are at a healthy weight and may help predict your risk of certain diseases, such as type 2 diabetes and high blood pressure. Heart rate and blood pressure. Body temperature. Skin for abnormal spots. What immunizations do I need?  Vaccines are usually given at various ages, according to a schedule. Your health care provider will recommend vaccines for you based on your age, medical history, and lifestyle or other factors, such as travel or where you work. What tests do I need? Screening Your health care provider may recommend screening tests for certain conditions. This may include: Lipid and cholesterol levels. Diabetes screening. This is done by checking your blood sugar (glucose) after you have not eaten for a while (fasting). Pelvic exam and Pap test. Hepatitis B test. Hepatitis C  test. HIV (human immunodeficiency virus) test. STI (sexually transmitted infection) testing, if you are at risk. Lung cancer screening. Colorectal cancer screening. Mammogram. Talk with your health care provider about when you should start having regular mammograms. This may depend on whether you have a family history of breast cancer. BRCA-related cancer screening. This may be done if you have a family history of breast, ovarian, tubal, or peritoneal cancers. Bone density scan. This is done to screen for osteoporosis. Talk with your health care provider about your test results, treatment options, and if necessary, the need for more tests. Follow these instructions at home: Eating and drinking  Eat a diet that includes fresh fruits and vegetables, whole grains, lean protein, and low-fat dairy products. Take vitamin and mineral supplements as recommended by your health care provider. Do not drink alcohol if: Your health care provider tells you not to drink. You are pregnant, may be pregnant, or are planning to become pregnant. If you drink alcohol: Limit how much you have to 0-1 drink a day. Know how much alcohol is in your drink. In the [place] one drink equals one 12 oz bottle of beer (355 mL), one 5 oz glass of wine (148 mL), or one 1 oz glass of hard liquor (44 mL). Lifestyle Brush your teeth every morning and night with fluoride toothpaste. Floss one time each day. Exercise for at least 30 minutes 5 or more days each week. Do not use any products that contain nicotine or tobacco. These products include cigarettes, chewing tobacco, and vaping devices, such as e-cigarettes. If you need help quitting, ask your health care provider. Do not use drugs. If you are sexually active, practice safe sex. Use a condom or other form of protection to   prevent STIs. If you do not wish to become pregnant, use a form of birth control. If you plan to become pregnant, see your health care provider for a  prepregnancy visit. Take aspirin only as told by your health care provider. Make sure that you understand how much to take and what form to take. Work with your health care provider to find out whether it is safe and beneficial for you to take aspirin daily. Find healthy ways to manage stress, such as: Meditation, yoga, or listening to music. Journaling. Talking to a trusted person. Spending time with friends and family. Minimize exposure to UV radiation to reduce your risk of skin cancer. Safety Always wear your seat belt while driving or riding in a vehicle. Do not drive: If you have been drinking alcohol. Do not ride with someone who has been drinking. When you are tired or distracted. While texting. If you have been using any mind-altering substances or drugs. Wear a helmet and other protective equipment during sports activities. If you have firearms in your house, make sure you follow all gun safety procedures. Seek help if you have been physically or sexually abused. What's next? Visit your health care provider once a year for an annual wellness visit. Ask your health care provider how often you should have your eyes and teeth checked. Stay up to date on all vaccines. This information is not intended to replace advice given to you by your health care provider. Make sure you discuss any questions you have with your health care provider. Document Revised: 10/19/2020 Document Reviewed: 10/19/2020 Elsevier Patient Education  [place].

## 2021-12-27 DIAGNOSIS — Z1211 Encounter for screening for malignant neoplasm of colon: Secondary | ICD-10-CM | POA: Diagnosis not present

## 2022-01-03 ENCOUNTER — Encounter: Payer: Self-pay | Admitting: Dermatology

## 2022-01-03 ENCOUNTER — Ambulatory Visit: Payer: BC Managed Care – PPO | Admitting: Dermatology

## 2022-01-03 DIAGNOSIS — Z1283 Encounter for screening for malignant neoplasm of skin: Secondary | ICD-10-CM

## 2022-01-03 DIAGNOSIS — D239 Other benign neoplasm of skin, unspecified: Secondary | ICD-10-CM

## 2022-01-03 DIAGNOSIS — L814 Other melanin hyperpigmentation: Secondary | ICD-10-CM

## 2022-01-03 DIAGNOSIS — L578 Other skin changes due to chronic exposure to nonionizing radiation: Secondary | ICD-10-CM

## 2022-01-03 DIAGNOSIS — Z85828 Personal history of other malignant neoplasm of skin: Secondary | ICD-10-CM

## 2022-01-03 DIAGNOSIS — L821 Other seborrheic keratosis: Secondary | ICD-10-CM

## 2022-01-03 DIAGNOSIS — D1800 Hemangioma unspecified site: Secondary | ICD-10-CM

## 2022-01-03 DIAGNOSIS — D229 Melanocytic nevi, unspecified: Secondary | ICD-10-CM

## 2022-01-03 DIAGNOSIS — L0591 Pilonidal cyst without abscess: Secondary | ICD-10-CM

## 2022-01-03 DIAGNOSIS — B360 Pityriasis versicolor: Secondary | ICD-10-CM

## 2022-01-03 DIAGNOSIS — L219 Seborrheic dermatitis, unspecified: Secondary | ICD-10-CM | POA: Diagnosis not present

## 2022-01-03 DIAGNOSIS — Z86018 Personal history of other benign neoplasm: Secondary | ICD-10-CM

## 2022-01-03 MED ORDER — KETOCONAZOLE 2 % EX SHAM: MEDICATED_SHAMPOO | CUTANEOUS | 6 refills | Status: DC

## 2022-01-03 NOTE — Patient Instructions (Signed)
Due to recent changes in healthcare laws, you may see results of your pathology and/or laboratory studies on MyChart before the doctors have had a chance to review them. We understand that in some cases there may be results that are confusing or concerning to you. Please understand that not all results are received at the same time and often the doctors may need to interpret multiple results in order to provide you with the best plan of care or course of treatment. Therefore, we ask that you please give us 2 business days to thoroughly review all your results before contacting the office for clarification. Should we see a critical lab result, you will be contacted sooner.   If You Need Anything After Your Visit  If you have any questions or concerns for your doctor, please call our main line at [phone] and press option 4 to reach your doctor's medical assistant. If no one answers, please leave a voicemail as directed and we will return your call as soon as possible. Messages left after 4 pm will be answered the following business day.   You may also send us a message via MyChart. We typically respond to MyChart messages within 1-2 business days.  For prescription refills, please ask your pharmacy to contact our office. Our fax number is [phone].  If you have an urgent issue when the clinic is closed that cannot wait until the next business day, you can page your doctor at the number below.    Please note that while we do our best to be available for urgent issues outside of office hours, we are not available 24/7.   If you have an urgent issue and are unable to reach us, you may choose to seek medical care at your doctor's office, retail clinic, urgent care center, or emergency room.  If you have a medical emergency, please immediately call 911 or go to the emergency department.  Pager Numbers  - Dr. Kowalski: [phone]  - Dr. Moye: [phone]  - Dr. Stewart:  [phone]  In the event of inclement weather, please call our main line at [phone] for an update on the status of any delays or closures.  Dermatology Medication Tips: Please keep the boxes that topical medications come in in order to help keep track of the instructions about where and how to use these. Pharmacies typically print the medication instructions only on the boxes and not directly on the medication tubes.   If your medication is too expensive, please contact our office at [phone] option 4 or send us a message through MyChart.   We are unable to tell what your co-pay for medications will be in advance as this is different depending on your insurance coverage. However, we may be able to find a substitute medication at lower cost or fill out paperwork to get insurance to cover a needed medication.   If a prior authorization is required to get your medication covered by your insurance company, please allow us 1-2 business days to complete this process.  Drug prices often vary depending on where the prescription is filled and some pharmacies may offer cheaper prices.  The website www.goodrx.com contains coupons for medications through different pharmacies. The prices here do not account for what the cost may be with help from insurance (it may be cheaper with your insurance), but the website can give you the price if you did not use any insurance.  - You can print the associated coupon and take it with   your prescription to the pharmacy.  - You may also stop by our office during regular business hours and pick up a GoodRx coupon card.  - If you need your prescription sent electronically to a different pharmacy, notify our office through [facility] MyChart or by phone at [phone] option 4.     Si Usted Necesita Algo Despus de Su Visita  Tambin puede enviarnos un mensaje a travs de MyChart. Por lo general respondemos a los mensajes de MyChart en el transcurso de 1 a 2  das hbiles.  Para renovar recetas, por favor pida a su farmacia que se ponga en contacto con nuestra oficina. Nuestro nmero de fax es el [phone].  Si tiene un asunto urgente cuando la clnica est cerrada y que no puede esperar hasta el siguiente da hbil, puede llamar/localizar a su doctor(a) al nmero que aparece a continuacin.   Por favor, tenga en cuenta que aunque hacemos todo lo posible para estar disponibles para asuntos urgentes fuera del horario de oficina, no estamos disponibles las 24 horas del da, los 7 das de la semana.   Si tiene un problema urgente y no puede comunicarse con nosotros, puede optar por buscar atencin mdica  en el consultorio de su doctor(a), en una clnica privada, en un centro de atencin urgente o en una sala de emergencias.  Si tiene una emergencia mdica, por favor llame inmediatamente al 911 o vaya a la sala de emergencias.  Nmeros de bper  - Dr. Kowalski: [phone]  - Dra. Moye: [phone]  - Dra. Stewart: [phone]  En caso de inclemencias del tiempo, por favor llame a nuestra lnea principal al [phone] para una actualizacin sobre el estado de cualquier retraso o cierre.  Consejos para la medicacin en dermatologa: Por favor, guarde las cajas en las que vienen los medicamentos de uso tpico para ayudarle a seguir las instrucciones sobre dnde y cmo usarlos. Las farmacias generalmente imprimen las instrucciones del medicamento slo en las cajas y no directamente en los tubos del medicamento.   Si su medicamento es muy caro, por favor, pngase en contacto con nuestra oficina llamando al [phone] y presione la opcin 4 o envenos un mensaje a travs de MyChart.   No podemos decirle cul ser su copago por los medicamentos por adelantado ya que esto es diferente dependiendo de la cobertura de su seguro. Sin embargo, es posible que podamos encontrar un medicamento sustituto a menor costo o llenar un formulario para que el  seguro cubra el medicamento que se considera necesario.   Si se requiere una autorizacin previa para que su compaa de seguros cubra su medicamento, por favor permtanos de 1 a 2 das hbiles para completar este proceso.  Los precios de los medicamentos varan con frecuencia dependiendo del lugar de dnde se surte la receta y alguna farmacias pueden ofrecer precios ms baratos.  El sitio web www.goodrx.com tiene cupones para medicamentos de diferentes farmacias. Los precios aqu no tienen en cuenta lo que podra costar con la ayuda del seguro (puede ser ms barato con su seguro), pero el sitio web puede darle el precio si no utiliz ningn seguro.  - Puede imprimir el cupn correspondiente y llevarlo con su receta a la farmacia.  - Tambin puede pasar por nuestra oficina durante el horario de atencin regular y recoger una tarjeta de cupones de GoodRx.  - Si necesita que su receta se enve electrnicamente a una farmacia diferente, informe a nuestra oficina a travs de MyChart de [facility]   o por telfono llamando al [phone] y presione la opcin 4.  

## 2022-01-03 NOTE — Progress Notes (Signed)
Follow-Up Visit   Subjective  Julie Skinner is a 46 y.o. female who presents for the following: Annual Exam (Hx BCC, dysplastic nevi ). The patient presents for Total-Body Skin Exam (TBSE) for skin cancer screening and mole check.  The patient has spots, moles and lesions to be evaluated, some may be new or changing and the patient has concerns that these could be cancer.   The following portions of the chart were reviewed this encounter and updated as appropriate:   Tobacco  Allergies  Meds  Problems  Med Hx  Surg Hx  Fam Hx     Review of Systems:  No other skin or systemic complaints except as noted in HPI or Assessment and Plan.  Objective  Well appearing patient in no apparent distress; mood and affect are within normal limits.  A full examination was performed including scalp, head, eyes, ears, nose, lips, neck, chest, axillae, abdomen, back, buttocks, bilateral upper extremities, bilateral lower extremities, hands, feet, fingers, toes, fingernails, and toenails. All findings within normal limits unless otherwise noted below.  Scalp Pink patches with greasy scale.   Back Firm SQ nodule.   Assessment & Plan  Seborrheic dermatitis Scalp Seborrheic Dermatitis  -  is a chronic persistent rash characterized by pinkness and scaling most commonly of the mid face but also can occur on the scalp (dandruff), ears; mid chest, mid back and groin.  It tends to be exacerbated by stress and cooler weather.  People who have neurologic disease may experience new onset or exacerbation of existing seborrheic dermatitis.  The condition is not curable but treatable and can be controlled.  Start Ketoconazole 2% shampoo 2-3 days per week. Let sit 5-10 minutes before washing out.  Tinea versicolor  Related Medications ketoconazole (NIZORAL) 2 % shampoo Shampoo into scalp and body let sit 10 minutes then wash off up to three days per week  Pilonidal cyst sacral area Benign, not  harmful, but may be excised with general surgeon due to possible involvement with spine.  If bothersome, we would  be happy to refer to general surgeon.  Lentigines - Scattered tan macules - Due to sun exposure - Benign-appearing, observe - Recommend daily broad spectrum sunscreen SPF 30+ to sun-exposed areas, reapply every 2 hours as needed. - Call for any changes  Seborrheic Keratoses - Stuck-on, waxy, tan-brown papules and/or plaques  - Benign-appearing - Discussed benign etiology and prognosis. - Observe - Call for any changes  Melanocytic Nevi - Tan-brown and/or pink-flesh-colored symmetric macules and papules - Benign appearing on exam today - Observation - Call clinic for new or changing moles - Recommend daily use of broad spectrum spf 30+ sunscreen to sun-exposed areas.   Hemangiomas - Red papules - Discussed benign nature - Observe - Call for any changes  Actinic Damage - Chronic condition, secondary to cumulative UV/sun exposure - diffuse scaly erythematous macules with underlying dyspigmentation - Recommend daily broad spectrum sunscreen SPF 30+ to sun-exposed areas, reapply every 2 hours as needed.  - Staying in the shade or wearing long sleeves, sun glasses (UVA+UVB protection) and wide brim hats (4-inch brim around the entire circumference of the hat) are also recommended for sun protection.  - Call for new or changing lesions.  History of Basal Cell Carcinoma of the Skin - No evidence of recurrence today - Recommend regular full body skin exams - Recommend daily broad spectrum sunscreen SPF 30+ to sun-exposed areas, reapply every 2 hours as needed.  - Call if  any new or changing lesions are noted between office visits  History of Dysplastic Nevi - No evidence of recurrence today - Recommend regular full body skin exams - Recommend daily broad spectrum sunscreen SPF 30+ to sun-exposed areas, reapply every 2 hours as needed.  - Call if any new or changing  lesions are noted between office visits  Skin cancer screening performed today.  Return in about 1 year (around 01/04/2023) for TBSE.  Luther Redo, CMA, am acting as scribe for Sarina Ser, MD . Documentation: I have reviewed the above documentation for accuracy and completeness, and I agree with the above.  Sarina Ser, MD

## 2022-01-04 DIAGNOSIS — H40013 Open angle with borderline findings, low risk, bilateral: Secondary | ICD-10-CM | POA: Diagnosis not present

## 2022-01-05 LAB — COLOGUARD: COLOGUARD: NEGATIVE

## 2022-01-08 DIAGNOSIS — U071 COVID-19: Secondary | ICD-10-CM | POA: Diagnosis not present

## 2022-01-08 DIAGNOSIS — Z03818 Encounter for observation for suspected exposure to other biological agents ruled out: Secondary | ICD-10-CM | POA: Diagnosis not present

## 2022-01-09 ENCOUNTER — Encounter: Payer: Self-pay | Admitting: Certified Nurse Midwife

## 2022-02-04 ENCOUNTER — Other Ambulatory Visit: Payer: Self-pay | Admitting: Certified Nurse Midwife

## 2022-03-26 ENCOUNTER — Other Ambulatory Visit: Payer: Self-pay | Admitting: Certified Nurse Midwife

## 2022-03-26 DIAGNOSIS — Z1231 Encounter for screening mammogram for malignant neoplasm of breast: Secondary | ICD-10-CM

## 2022-04-02 ENCOUNTER — Ambulatory Visit
Admission: RE | Admit: 2022-04-02 | Discharge: 2022-04-02 | Disposition: A | Discharge status: Home / Self Care | Payer: BC Managed Care – PPO | Source: Ambulatory Visit | Attending: Certified Nurse Midwife | Admitting: Certified Nurse Midwife

## 2022-04-02 DIAGNOSIS — Z1231 Encounter for screening mammogram for malignant neoplasm of breast: Secondary | ICD-10-CM | POA: Diagnosis not present

## 2022-05-21 ENCOUNTER — Encounter: Payer: Self-pay | Admitting: Certified Nurse Midwife

## 2022-05-22 ENCOUNTER — Other Ambulatory Visit (HOSPITAL_COMMUNITY)
Admission: RE | Admit: 2022-05-22 | Discharge: 2022-05-22 | Disposition: A | Discharge status: Home / Self Care | Payer: BC Managed Care – PPO | Source: Ambulatory Visit | Attending: Certified Nurse Midwife | Admitting: Certified Nurse Midwife

## 2022-05-22 ENCOUNTER — Ambulatory Visit: Payer: BC Managed Care – PPO

## 2022-05-22 VITALS — BP 123/87 | HR 103 | Ht 64.0 in | Wt 158.0 lb

## 2022-05-22 DIAGNOSIS — N898 Other specified noninflammatory disorders of vagina: Secondary | ICD-10-CM

## 2022-05-22 NOTE — Progress Notes (Signed)
    NURSE VISIT NOTE  Subjective:    Patient ID: HAE AHLERS, female    DOB: 08/28/1975, 47 y.o.   MRN: [id number]  HPI  Patient is a 47 y.o. G10P2002 female who presents for white, foul, and discomfort during intercourse and vaginal discomfort Symptoms  1 month(s). Denies abnormal vaginal bleeding or significant pelvic pain or fever. admits to bleeding . Patient denies history of known exposure to STD.   Objective:    BP 123/87   Pulse (!) 103   Wt 158 lb (71.7 kg)   BMI 27.12 kg/m    '@THIS'$  VISIT ONLY@  Assessment:   No diagnosis found.    Plan:    ROV prn if symptoms persist or worsen.   Landis Gandy, [place]

## 2022-05-24 LAB — CERVICOVAGINAL ANCILLARY ONLY
Bacterial Vaginitis (gardnerella): POSITIVE — AB
Candida Glabrata: NEGATIVE
Candida Vaginitis: NEGATIVE
Chlamydia: NEGATIVE
Comment: NEGATIVE
Comment: NEGATIVE
Comment: NEGATIVE
Comment: NEGATIVE
Comment: NEGATIVE
Comment: NORMAL
Neisseria Gonorrhea: NEGATIVE
Trichomonas: POSITIVE — AB

## 2022-05-25 ENCOUNTER — Other Ambulatory Visit: Payer: Self-pay

## 2022-05-25 DIAGNOSIS — B9689 Other specified bacterial agents as the cause of diseases classified elsewhere: Secondary | ICD-10-CM

## 2022-05-25 DIAGNOSIS — A599 Trichomoniasis, unspecified: Secondary | ICD-10-CM

## 2022-05-25 MED ORDER — METRONIDAZOLE 500 MG PO TABS: 500.0000 mg | ORAL_TABLET | Freq: Two times a day (BID) | ORAL | 1 refills | Status: DC

## 2022-05-25 NOTE — Progress Notes (Signed)
Flagyl 500 mg bid 7 days sent in due to BV and Trich+. Patient aware of results and medication being sent in.

## 2022-05-26 ENCOUNTER — Other Ambulatory Visit: Payer: Self-pay | Admitting: Certified Nurse Midwife

## 2022-05-26 ENCOUNTER — Encounter: Payer: Self-pay | Admitting: Certified Nurse Midwife

## 2022-07-17 ENCOUNTER — Encounter: Payer: Self-pay | Admitting: Certified Nurse Midwife

## 2022-07-18 ENCOUNTER — Other Ambulatory Visit: Payer: Self-pay | Admitting: Certified Nurse Midwife

## 2022-07-18 ENCOUNTER — Other Ambulatory Visit (HOSPITAL_COMMUNITY)
Admission: RE | Admit: 2022-07-18 | Discharge: 2022-07-18 | Disposition: A | Discharge status: Home / Self Care | Payer: BC Managed Care – PPO | Source: Ambulatory Visit | Attending: Certified Nurse Midwife | Admitting: Certified Nurse Midwife

## 2022-07-18 DIAGNOSIS — Z113 Encounter for screening for infections with a predominantly sexual mode of transmission: Secondary | ICD-10-CM | POA: Diagnosis not present

## 2022-07-20 ENCOUNTER — Other Ambulatory Visit: Payer: BC Managed Care – PPO

## 2022-07-20 ENCOUNTER — Ambulatory Visit (INDEPENDENT_AMBULATORY_CARE_PROVIDER_SITE_OTHER): Payer: BC Managed Care – PPO

## 2022-07-20 VITALS — BP 113/57 | HR 85 | Ht 64.0 in | Wt 150.0 lb

## 2022-07-20 DIAGNOSIS — Z113 Encounter for screening for infections with a predominantly sexual mode of transmission: Secondary | ICD-10-CM

## 2022-07-20 NOTE — Progress Notes (Signed)
    NURSE VISIT NOTE  Subjective:    Patient ID: Julie Skinner, female    DOB: 11-Apr-1976, 47 y.o.   MRN: [id number]  HPI  Patient is a 47 y.o. G49P2002 female who presents for treatment of care for BV.    Objective:    Ht 5\' 4"  (1.626 m)   Wt 150 lb (68 kg)   BMI 25.75 kg/m     Assessment:   1. Screening for STD (sexually transmitted disease)      Plan:   GC and chlamydia DNA  probe sent to lab. Treatment: abstain from coitus during course of treatment ROV prn if symptoms persist or worsen.   Landis Gandy, [place]

## 2022-07-21 LAB — HSV 1 AND 2 AB, IGG
HSV 1 Glycoprotein G Ab, IgG: 61.2 index — ABNORMAL HIGH (ref 0.00–0.90)
HSV 2 IgG, Type Spec: <0.91 index (ref 0.00–0.90)

## 2022-07-21 LAB — RPR: RPR Ser Ql: NONREACTIVE

## 2022-07-21 LAB — HEPATITIS C ANTIBODY: Hep C Virus Ab: NONREACTIVE

## 2022-07-21 LAB — HEPATITIS B SURFACE ANTIGEN: Hepatitis B Surface Ag: NEGATIVE

## 2022-07-21 LAB — HIV ANTIBODY (ROUTINE TESTING W REFLEX): HIV Screen 4th Generation wRfx: NONREACTIVE

## 2022-07-23 LAB — CERVICOVAGINAL ANCILLARY ONLY
Bacterial Vaginitis (gardnerella): NEGATIVE
Candida Glabrata: NEGATIVE
Candida Vaginitis: NEGATIVE
Chlamydia: NEGATIVE
Comment: NEGATIVE
Comment: NEGATIVE
Comment: NEGATIVE
Comment: NEGATIVE
Comment: NEGATIVE
Comment: NORMAL
Neisseria Gonorrhea: NEGATIVE
Trichomonas: NEGATIVE

## 2022-07-24 ENCOUNTER — Encounter: Payer: Self-pay | Admitting: Certified Nurse Midwife

## 2022-07-30 DIAGNOSIS — F330 Major depressive disorder, recurrent, mild: Secondary | ICD-10-CM | POA: Diagnosis not present

## 2022-08-03 DIAGNOSIS — F330 Major depressive disorder, recurrent, mild: Secondary | ICD-10-CM | POA: Diagnosis not present

## 2022-08-06 DIAGNOSIS — F330 Major depressive disorder, recurrent, mild: Secondary | ICD-10-CM | POA: Diagnosis not present

## 2022-08-24 DIAGNOSIS — F330 Major depressive disorder, recurrent, mild: Secondary | ICD-10-CM | POA: Diagnosis not present

## 2022-08-28 DIAGNOSIS — A499 Bacterial infection, unspecified: Secondary | ICD-10-CM | POA: Diagnosis not present

## 2022-09-03 DIAGNOSIS — J40 Bronchitis, not specified as acute or chronic: Secondary | ICD-10-CM | POA: Diagnosis not present

## 2022-09-03 DIAGNOSIS — J019 Acute sinusitis, unspecified: Secondary | ICD-10-CM | POA: Diagnosis not present

## 2022-09-05 DIAGNOSIS — F330 Major depressive disorder, recurrent, mild: Secondary | ICD-10-CM | POA: Diagnosis not present

## 2022-09-17 ENCOUNTER — Other Ambulatory Visit: Payer: Self-pay | Admitting: Certified Nurse Midwife

## 2022-09-18 DIAGNOSIS — F330 Major depressive disorder, recurrent, mild: Secondary | ICD-10-CM | POA: Diagnosis not present

## 2022-09-27 DIAGNOSIS — F330 Major depressive disorder, recurrent, mild: Secondary | ICD-10-CM | POA: Diagnosis not present

## 2022-10-10 DIAGNOSIS — F330 Major depressive disorder, recurrent, mild: Secondary | ICD-10-CM | POA: Diagnosis not present

## 2022-10-15 ENCOUNTER — Other Ambulatory Visit: Payer: Self-pay

## 2022-10-17 DIAGNOSIS — F330 Major depressive disorder, recurrent, mild: Secondary | ICD-10-CM | POA: Diagnosis not present

## 2022-10-18 ENCOUNTER — Encounter: Payer: Self-pay | Admitting: Certified Nurse Midwife

## 2022-10-18 ENCOUNTER — Other Ambulatory Visit: Payer: Self-pay | Admitting: Certified Nurse Midwife

## 2022-10-18 MED ORDER — SERTRALINE HCL 50 MG PO TABS: 50.0000 mg | ORAL_TABLET | Freq: Every day | ORAL | 3 refills | Status: DC

## 2022-10-18 NOTE — Progress Notes (Signed)
Pt requesting refill on Zoloft for seasonal depression. Orders placed.

## 2022-10-22 DIAGNOSIS — F330 Major depressive disorder, recurrent, mild: Secondary | ICD-10-CM | POA: Diagnosis not present

## 2022-10-29 ENCOUNTER — Other Ambulatory Visit (HOSPITAL_COMMUNITY)
Admission: RE | Admit: 2022-10-29 | Discharge: 2022-10-29 | Disposition: A | Discharge status: Home / Self Care | Payer: BC Managed Care – PPO | Source: Ambulatory Visit | Attending: Obstetrics and Gynecology | Admitting: Obstetrics and Gynecology

## 2022-10-29 ENCOUNTER — Encounter: Payer: Self-pay | Admitting: Obstetrics and Gynecology

## 2022-10-29 ENCOUNTER — Ambulatory Visit: Payer: BC Managed Care – PPO | Admitting: Obstetrics and Gynecology

## 2022-10-29 VITALS — BP 98/70 | Ht 64.0 in | Wt 137.0 lb

## 2022-10-29 DIAGNOSIS — Z113 Encounter for screening for infections with a predominantly sexual mode of transmission: Secondary | ICD-10-CM | POA: Diagnosis not present

## 2022-10-29 DIAGNOSIS — Z30431 Encounter for routine checking of intrauterine contraceptive device: Secondary | ICD-10-CM

## 2022-10-29 DIAGNOSIS — R399 Unspecified symptoms and signs involving the genitourinary system: Secondary | ICD-10-CM | POA: Diagnosis not present

## 2022-10-29 DIAGNOSIS — R590 Localized enlarged lymph nodes: Secondary | ICD-10-CM | POA: Diagnosis not present

## 2022-10-29 DIAGNOSIS — R102 Pelvic and perineal pain: Secondary | ICD-10-CM

## 2022-10-29 LAB — POCT URINALYSIS DIPSTICK
Bilirubin, UA: NEGATIVE
Glucose, UA: NEGATIVE
Ketones, UA: NEGATIVE
Leukocytes, UA: NEGATIVE
Nitrite, UA: NEGATIVE
Protein, UA: NEGATIVE
Spec Grav, UA: 1.015 (ref 1.010–1.025)
pH, UA: 6.5 (ref 5.0–8.0)

## 2022-10-29 NOTE — Progress Notes (Signed)
Patient, No Pcp Per   Chief Complaint  Patient presents with   Urinary Tract Infection    Slight burning/discomfort urinating   IUD Check    HPI:      Ms. Julie Skinner is a 47 y.o. G2P2002 whose LMP was No LMP recorded. (Menstrual status: IUD)., presents today for urinary pressure and dysuria for a couple wks. Always has urinary frequency so no change in sx. Has also had some mild nausea and didn't feel well yesterday. No hematuria, LBP, fevers. Drinks lots of water daily. No vag sx; hx of BV in the past. She is sexually active with new partner, no pain/bleeding. Noticed a swollen, tender lymph node in RT groin today. No leg lesions/cuts, no vag lesions. Hx of cold sores. Neg HSV 2 IgG 3/24. Mirena placed 10/27/19, has occas spotting only.   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   fibroadenoma, with marker, at Dr. Rutherford Nail office   BREAST BIOPSY Left 03/13/2019   PASH   BREAST EXCISIONAL BIOPSY Left 1999   lump removed   BREAST EXCISIONAL BIOPSY Left 1993   BENIGN   CESAREAN SECTION  2009,2006   THYMECTOMY  2007   thymetomy  2007    Family History  Problem Relation Age of Onset   Heart disease Mother    Hypertension Mother    Heart disease Father    Hypertension Father    Breast cancer Paternal Aunt        possible 71s   Heart disease Maternal Grandmother    Cancer Maternal Grandfather        thinks prostate cancer   Breast cancer Paternal Grandmother 40    Social History   Socioeconomic History   Marital status: Married    Spouse name: Not on file   Number of children: Not on file   Years of education: Not on file   Highest education level: Not on file  Occupational History   Not on file  Tobacco Use   Smoking status: Never   Smokeless tobacco: Never  Vaping Use   Vaping Use:  Never used  Substance and Sexual Activity   Alcohol use: Yes    Comment: occas   Drug use: No   Sexual activity: Yes    Birth control/protection: I.U.D.    Comment: mirena  Other Topics Concern   Not on file  Social History Narrative   Not on file   Social Determinants of Health   Financial Resource Strain: Not on file  Food Insecurity: Not on file  Transportation Needs: Not on file  Physical Activity: Not on file  Stress: Not on file  Social Connections: Not on file  Intimate Partner Violence: Not on file    Outpatient Medications Prior to Visit  Medication Sig Dispense Refill   Cholecalciferol (VITAMIN D-3 PO) Take by mouth.     ketoconazole (NIZORAL) 2 % shampoo Shampoo into scalp and body let sit 10 minutes then wash off up to three days per week 120 mL 6   levonorgestrel (MIRENA) 20 MCG/24HR IUD 1 each by Intrauterine route once.     losartan (COZAAR) 50 MG tablet TAKE 1 TABLET BY MOUTH DAILY 30 tablet 6   sertraline (ZOLOFT) 50 MG tablet Take 1 tablet (50 mg total) by mouth daily. 30 tablet 3  metroNIDAZOLE (FLAGYL) 500 MG tablet Take 1 tablet (500 mg total) by mouth 2 (two) times daily. 14 tablet 1   Facility-Administered Medications Prior to Visit  Medication Dose Route Frequency Provider Last Rate Last Admin   cyanocobalamin ((VITAMIN B-12)) injection 1,000 mcg  1,000 mcg Intramuscular Once Shambley, Melody N, CNM       cyanocobalamin ((VITAMIN B-12)) injection 1,000 mcg  1,000 mcg Intramuscular Once Shambley, Melody N, CNM          ROS:  Review of Systems  Constitutional:  Negative for fever.  Gastrointestinal:  Negative for blood in stool, constipation, diarrhea, nausea and vomiting.  Genitourinary:  Positive for dysuria and frequency. Negative for dyspareunia, flank pain, hematuria, urgency, vaginal bleeding, vaginal discharge and vaginal pain.  Musculoskeletal:  Negative for back pain.  Skin:  Negative for rash.   BREAST: No symptoms   OBJECTIVE:    Vitals:  BP 98/70   Ht 5\' 4"  (1.626 m)   Wt 137 lb (62.1 kg)   BMI 23.52 kg/m   Physical Exam Vitals reviewed.  Constitutional:      Appearance: She is well-developed.  Pulmonary:     Effort: Pulmonary effort is normal.  Genitourinary:    General: Normal vulva.     Pubic Area: No rash.      Labia:        Right: No rash, tenderness or lesion.        Left: No rash, tenderness or lesion.      Vagina: Normal. No vaginal discharge, erythema, tenderness or bleeding.     Cervix: Normal.     Uterus: Normal. Not enlarged and not tender.      Adnexa: Right adnexa normal and left adnexa normal.       Right: No mass or tenderness.         Left: No mass or tenderness.       Comments: IUD STRINGS IN CX OS Musculoskeletal:        General: Normal range of motion.     Cervical back: Normal range of motion.  Lymphadenopathy:     Lower Body: Right inguinal adenopathy present.     Comments: TENDER RT ING LAN  Skin:    General: Skin is warm and dry.  Neurological:     General: No focal deficit present.     Mental Status: She is alert and oriented to person, place, and time.  Psychiatric:        Mood and Affect: Mood normal.        Behavior: Behavior normal.        Thought Content: Thought content normal.        Judgment: Judgment normal.     Results: Results for orders placed or performed in visit on 10/29/22 (from the past 24 hour(s))  POCT Urinalysis Dipstick     Status: Abnormal   Collection Time: 10/29/22  3:23 PM  Result Value Ref Range   Color, UA yellow    Clarity, UA clear    Glucose, UA Negative Negative   Bilirubin, UA neg    Ketones, UA neg    Spec Grav, UA 1.015 1.010 - 1.025   Blood, UA trace    pH, UA 6.5 5.0 - 8.0   Protein, UA Negative Negative   Urobilinogen, UA     Nitrite, UA neg    Leukocytes, UA Negative Negative   Appearance     Odor       Assessment/Plan:  Pelvic pressure in  female--rule out UTI/STDs. IUD strings in cx os. If tests neg and sx  persist, will check GYN u/s.   UTI symptoms - Plan: POCT Urinalysis Dipstick, Urine Culture; neg UA. Check C&S. Will f/u with results.   Screening for STD (sexually transmitted disease) - Plan: Cervicovaginal ancillary only  Lymphadenopathy, inguinal--pt to look for vaginal lesions given sx. RTO ASAP for culture if develop. Otherwise, sx should resolve/NSAIDs. F/u prn.   Encounter for routine checking of intrauterine contraceptive device (IUD)--IUD strings in cx os.     Return if symptoms worsen or fail to improve.  Jori Thrall B. Jaelyn Cloninger, PA-C 10/29/2022 3:24 PM

## 2022-10-29 NOTE — Patient Instructions (Signed)
I value your feedback and you entrusting us with your care. If you get a [facility] patient survey, I would appreciate you taking the time to let us know about your experience today. Thank you! ? ? ?

## 2022-10-31 DIAGNOSIS — F330 Major depressive disorder, recurrent, mild: Secondary | ICD-10-CM | POA: Diagnosis not present

## 2022-10-31 LAB — URINE CULTURE

## 2022-11-05 ENCOUNTER — Encounter: Payer: Self-pay | Admitting: Certified Nurse Midwife

## 2022-11-06 LAB — CERVICOVAGINAL ANCILLARY ONLY
Bacterial Vaginitis (gardnerella): NEGATIVE
Candida Glabrata: NEGATIVE
Candida Vaginitis: POSITIVE — AB
Chlamydia: NEGATIVE
Comment: NEGATIVE
Comment: NEGATIVE
Comment: NEGATIVE
Comment: NEGATIVE
Comment: NORMAL
Neisseria Gonorrhea: NEGATIVE

## 2022-11-06 MED ORDER — FLUCONAZOLE 150 MG PO TABS: 150.0000 mg | ORAL_TABLET | Freq: Once | ORAL | 0 refills | Status: AC

## 2022-11-06 NOTE — Addendum Note (Signed)
Addended by: Althea Grimmer B on: 11/06/2022 08:54 PM   Modules accepted: Orders

## 2022-11-15 DIAGNOSIS — F330 Major depressive disorder, recurrent, mild: Secondary | ICD-10-CM | POA: Diagnosis not present

## 2022-11-22 DIAGNOSIS — F330 Major depressive disorder, recurrent, mild: Secondary | ICD-10-CM | POA: Diagnosis not present

## 2022-12-10 DIAGNOSIS — F330 Major depressive disorder, recurrent, mild: Secondary | ICD-10-CM | POA: Diagnosis not present

## 2023-01-02 DIAGNOSIS — F330 Major depressive disorder, recurrent, mild: Secondary | ICD-10-CM | POA: Diagnosis not present

## 2023-01-03 ENCOUNTER — Encounter: Payer: Self-pay | Admitting: Certified Nurse Midwife

## 2023-01-03 ENCOUNTER — Ambulatory Visit (INDEPENDENT_AMBULATORY_CARE_PROVIDER_SITE_OTHER): Payer: BC Managed Care – PPO | Admitting: Certified Nurse Midwife

## 2023-01-03 VITALS — BP 119/68 | HR 64 | Ht 64.0 in | Wt 138.9 lb

## 2023-01-03 DIAGNOSIS — Z01419 Encounter for gynecological examination (general) (routine) without abnormal findings: Secondary | ICD-10-CM

## 2023-01-03 DIAGNOSIS — N951 Menopausal and female climacteric states: Secondary | ICD-10-CM

## 2023-01-03 DIAGNOSIS — Z1231 Encounter for screening mammogram for malignant neoplasm of breast: Secondary | ICD-10-CM

## 2023-01-03 NOTE — Progress Notes (Signed)
GYNECOLOGY ANNUAL PREVENTATIVE CARE ENCOUNTER NOTE  History:     Julie Skinner is a 47 y.o. G51P2002 female here for a routine annual gynecologic exam.  Current complaints: having some perimenopausal symptoms , she has noticed feeling more "hot" she state she always used to be cold.   Denies abnormal vaginal bleeding, discharge, pelvic pain, problems with intercourse or other gynecologic concerns.     Social Relationship: Married  Living: partner and children  Work: Insurance underwriter  Exercise: few x week  Smoke/Alcohol/drug use: denies use   Gynecologic History No LMP recorded. (Menstrual status: IUD). Contraception: IUD Last Pap: 12/05/2019. Results were: normal with negative HPV Last mammogram: 04/02/2022. Results were: normal  Obstetric History OB History  Gravida Para Term Preterm AB Living  2 2 2     2   SAB IAB Ectopic Multiple Live Births          2    # Outcome Date GA Lbr Len/2nd Weight Sex Type Anes PTL Lv  2 Term 06/29/07   7 lb (3.175 kg) M CS-Unspec  N LIV  1 Term 01/12/05   8 lb (3.629 kg) F CS-Unspec  N LIV     Complications: Breech extraction    Obstetric Comments  1st Menstrual Cycle: 10  1st Pregnancy:  29    Past Medical History:  Diagnosis Date   Dysplastic nevus 11/21/2020   L prox ant thigh - mild   Hx of basal cell carcinoma 12/30/2014   L lateral thigh. Superficial   Hx of dysplastic nevus 07/04/2017   R lat knee. Moderate to severe atypia, lateral margin involved. Excised 10/08/2017, margins free.   Left breast lump    Myasthenia gravis (HCC) 2007   Myasthenia gravis (HCC)    Vaginal Pap smear, abnormal     Past Surgical History:  Procedure Laterality Date   BREAST BIOPSY Left 2015?   fibroadenoma, with marker, at Dr. Rutherford Nail office   BREAST BIOPSY Left 03/13/2019   PASH   BREAST EXCISIONAL BIOPSY Left 1999   lump removed   BREAST EXCISIONAL BIOPSY Left 1993   BENIGN   CESAREAN SECTION  2009,2006   THYMECTOMY  2007    thymetomy  2007    Current Outpatient Medications on File Prior to Visit  Medication Sig Dispense Refill   Cholecalciferol (VITAMIN D-3 PO) Take by mouth.     ketoconazole (NIZORAL) 2 % shampoo Shampoo into scalp and body let sit 10 minutes then wash off up to three days per week 120 mL 6   levonorgestrel (MIRENA) 20 MCG/24HR IUD 1 each by Intrauterine route once.     losartan (COZAAR) 50 MG tablet TAKE 1 TABLET BY MOUTH DAILY 30 tablet 6   sertraline (ZOLOFT) 50 MG tablet Take 1 tablet (50 mg total) by mouth daily. 30 tablet 3   Current Facility-Administered Medications on File Prior to Visit  Medication Dose Route Frequency Provider Last Rate Last Admin   cyanocobalamin ((VITAMIN B-12)) injection 1,000 mcg  1,000 mcg Intramuscular Once Shambley, Melody N, CNM       cyanocobalamin ((VITAMIN B-12)) injection 1,000 mcg  1,000 mcg Intramuscular Once Shambley, Melody N, CNM        No Known Allergies  Social History:  reports that she has never smoked. She has never used smokeless tobacco. She reports current alcohol use. She reports that she does not use drugs.  Family History  Problem Relation Age of Onset   Heart  disease Mother    Hypertension Mother    Heart disease Father    Hypertension Father    Breast cancer Paternal Aunt        possible 32s   Heart disease Maternal Grandmother    Cancer Maternal Grandfather        thinks prostate cancer   Breast cancer Paternal Grandmother 39    The following portions of the patient's history were reviewed and updated as appropriate: allergies, current medications, past family history, past medical history, past social history, past surgical history and problem list.  Review of Systems Pertinent items noted in HPI and remainder of comprehensive ROS otherwise negative.  Physical Exam:  BP 119/68   Pulse 64   Ht 5\' 4"  (1.626 m)   Wt 138 lb 14.4 oz (63 kg)   BMI 23.84 kg/m  CONSTITUTIONAL: Well-developed, well-nourished female in no  acute distress.  HENT:  Normocephalic, atraumatic, External right and left ear normal. Oropharynx is clear and moist EYES: Conjunctivae and EOM are normal. Pupils are equal, round, and reactive to light. No scleral icterus.  NECK: Normal range of motion, supple, no masses.  Normal thyroid.  SKIN: Skin is warm and dry. No rash noted. Not diaphoretic. No erythema. No pallor. MUSCULOSKELETAL: Normal range of motion. No tenderness.  No cyanosis, clubbing, or edema.  2+ distal pulses. NEUROLOGIC: Alert and oriented to person, place, and time. Normal reflexes, muscle tone coordination.  PSYCHIATRIC: Normal mood and affect. Normal behavior. Normal judgment and thought content. CARDIOVASCULAR: Normal heart rate noted, regular rhythm RESPIRATORY: Clear to auscultation bilaterally. Effort and breath sounds normal, no problems with respiration noted. BREASTS: Symmetric in size. No masses, tenderness, skin changes, nipple drainage, or lymphadenopathy bilaterally.  ABDOMEN: Soft, no distention noted.  No tenderness, rebound or guarding.  PELVIC: Normal appearing external genitalia and urethral meatus; normal appearing vaginal mucosa and cervix.  No abnormal discharge noted.  Pap smear not due. Strings present .  Normal uterine size, no other palpable masses, no uterine or adnexal tenderness.  .   Assessment and Plan:    1. Women's annual routine gynecological examination  Pap: not due  Mammogram : ordered  Labs: none  Refills: none  Referral: none  Discussed SSRI to managed hot flashes, discussed estrogen replacement. She declines at this time. Encouraged soy in her diet.  Routine preventative health maintenance measures emphasized. Please refer to After Visit Summary for other counseling recommendations.      Doreene Burke, CNM [facility]  [facility],  [facility]

## 2023-01-03 NOTE — Patient Instructions (Signed)
Preventive Care 40-49 Years Old, Female Preventive care refers to lifestyle choices and visits with your health care provider that can promote health and wellness. Preventive care visits are also called wellness exams. What can I expect for my preventive care visit? Counseling Your health care provider may ask you questions about your: Medical history, including: Past medical problems. Family medical history. Pregnancy history. Current health, including: Menstrual cycle. Method of birth control. Emotional well-being. Home life and relationship well-being. Sexual activity and sexual health. Lifestyle, including: Alcohol, nicotine or tobacco, and drug use. Access to firearms. Diet, exercise, and sleep habits. Work and work environment. Sunscreen use. Safety issues such as seatbelt and bike helmet use. Physical exam Your health care provider will check your: Height and weight. These may be used to calculate your BMI (body mass index). BMI is a measurement that tells if you are at a healthy weight. Waist circumference. This measures the distance around your waistline. This measurement also tells if you are at a healthy weight and may help predict your risk of certain diseases, such as type 2 diabetes and high blood pressure. Heart rate and blood pressure. Body temperature. Skin for abnormal spots. What immunizations do I need?  Vaccines are usually given at various ages, according to a schedule. Your health care provider will recommend vaccines for you based on your age, medical history, and lifestyle or other factors, such as travel or where you work. What tests do I need? Screening Your health care provider may recommend screening tests for certain conditions. This may include: Lipid and cholesterol levels. Diabetes screening. This is done by checking your blood sugar (glucose) after you have not eaten for a while (fasting). Pelvic exam and Pap test. Hepatitis B test. Hepatitis C  test. HIV (human immunodeficiency virus) test. STI (sexually transmitted infection) testing, if you are at risk. Lung cancer screening. Colorectal cancer screening. Mammogram. Talk with your health care provider about when you should start having regular mammograms. This may depend on whether you have a family history of breast cancer. BRCA-related cancer screening. This may be done if you have a family history of breast, ovarian, tubal, or peritoneal cancers. Bone density scan. This is done to screen for osteoporosis. Talk with your health care provider about your test results, treatment options, and if necessary, the need for more tests. Follow these instructions at home: Eating and drinking  Eat a diet that includes fresh fruits and vegetables, whole grains, lean protein, and low-fat dairy products. Take vitamin and mineral supplements as recommended by your health care provider. Do not drink alcohol if: Your health care provider tells you not to drink. You are pregnant, may be pregnant, or are planning to become pregnant. If you drink alcohol: Limit how much you have to 0-1 drink a day. Know how much alcohol is in your drink. In the [place] one drink equals one 12 oz bottle of beer (355 mL), one 5 oz glass of wine (148 mL), or one 1 oz glass of hard liquor (44 mL). Lifestyle Brush your teeth every morning and night with fluoride toothpaste. Floss one time each day. Exercise for at least 30 minutes 5 or more days each week. Do not use any products that contain nicotine or tobacco. These products include cigarettes, chewing tobacco, and vaping devices, such as e-cigarettes. If you need help quitting, ask your health care provider. Do not use drugs. If you are sexually active, practice safe sex. Use a condom or other form of protection to   prevent STIs. If you do not wish to become pregnant, use a form of birth control. If you plan to become pregnant, see your health care provider for a  prepregnancy visit. Take aspirin only as told by your health care provider. Make sure that you understand how much to take and what form to take. Work with your health care provider to find out whether it is safe and beneficial for you to take aspirin daily. Find healthy ways to manage stress, such as: Meditation, yoga, or listening to music. Journaling. Talking to a trusted person. Spending time with friends and family. Minimize exposure to UV radiation to reduce your risk of skin cancer. Safety Always wear your seat belt while driving or riding in a vehicle. Do not drive: If you have been drinking alcohol. Do not ride with someone who has been drinking. When you are tired or distracted. While texting. If you have been using any mind-altering substances or drugs. Wear a helmet and other protective equipment during sports activities. If you have firearms in your house, make sure you follow all gun safety procedures. Seek help if you have been physically or sexually abused. What's next? Visit your health care provider once a year for an annual wellness visit. Ask your health care provider how often you should have your eyes and teeth checked. Stay up to date on all vaccines. This information is not intended to replace advice given to you by your health care provider. Make sure you discuss any questions you have with your health care provider. Document Revised: 10/19/2020 Document Reviewed: 10/19/2020 Elsevier Patient Education  2024 Elsevier Inc.  

## 2023-01-10 DIAGNOSIS — F330 Major depressive disorder, recurrent, mild: Secondary | ICD-10-CM | POA: Diagnosis not present

## 2023-01-30 DIAGNOSIS — F330 Major depressive disorder, recurrent, mild: Secondary | ICD-10-CM | POA: Diagnosis not present

## 2023-01-31 ENCOUNTER — Ambulatory Visit: Payer: BC Managed Care – PPO | Admitting: Dermatology

## 2023-01-31 ENCOUNTER — Encounter: Payer: Self-pay | Admitting: Dermatology

## 2023-01-31 DIAGNOSIS — Z7189 Other specified counseling: Secondary | ICD-10-CM

## 2023-01-31 DIAGNOSIS — B360 Pityriasis versicolor: Secondary | ICD-10-CM | POA: Diagnosis not present

## 2023-01-31 DIAGNOSIS — Z1283 Encounter for screening for malignant neoplasm of skin: Secondary | ICD-10-CM | POA: Diagnosis not present

## 2023-01-31 DIAGNOSIS — W908XXA Exposure to other nonionizing radiation, initial encounter: Secondary | ICD-10-CM

## 2023-01-31 DIAGNOSIS — Z86018 Personal history of other benign neoplasm: Secondary | ICD-10-CM

## 2023-01-31 DIAGNOSIS — L821 Other seborrheic keratosis: Secondary | ICD-10-CM

## 2023-01-31 DIAGNOSIS — L814 Other melanin hyperpigmentation: Secondary | ICD-10-CM

## 2023-01-31 DIAGNOSIS — L578 Other skin changes due to chronic exposure to nonionizing radiation: Secondary | ICD-10-CM

## 2023-01-31 DIAGNOSIS — Z85828 Personal history of other malignant neoplasm of skin: Secondary | ICD-10-CM

## 2023-01-31 DIAGNOSIS — D1801 Hemangioma of skin and subcutaneous tissue: Secondary | ICD-10-CM

## 2023-01-31 DIAGNOSIS — L219 Seborrheic dermatitis, unspecified: Secondary | ICD-10-CM

## 2023-01-31 DIAGNOSIS — D229 Melanocytic nevi, unspecified: Secondary | ICD-10-CM

## 2023-01-31 DIAGNOSIS — Z79899 Other long term (current) drug therapy: Secondary | ICD-10-CM

## 2023-01-31 MED ORDER — KETOCONAZOLE 2 % EX SHAM
MEDICATED_SHAMPOO | CUTANEOUS | 11 refills | Status: DC
Start: 2023-01-31 — End: 2023-09-02

## 2023-01-31 NOTE — Patient Instructions (Signed)
Melanoma ABCDEs  Melanoma is the most dangerous type of skin cancer, and is the leading cause of death from skin disease.  You are more likely to develop melanoma if you: Have light-colored skin, light-colored eyes, or red or blond hair Spend a lot of time in the sun Tan regularly, either outdoors or in a tanning bed Have had blistering sunburns, especially during childhood Have a close family member who has had a melanoma Have atypical moles or large birthmarks  Early detection of melanoma is key since treatment is typically straightforward and cure rates are extremely high if we catch it early.   The first sign of melanoma is often a change in a mole or a new dark spot.  The ABCDE system is a way of remembering the signs of melanoma.  A for asymmetry:  The two halves do not match. B for border:  The edges of the growth are irregular. C for color:  A mixture of colors are present instead of an even brown color. D for diameter:  Melanomas are usually (but not always) greater than 6mm - the size of a pencil eraser. E for evolution:  The spot keeps changing in size, shape, and color.  Please check your skin once per month between visits. You can use a small mirror in front and a large mirror behind you to keep an eye on the back side or your body.   If you see any new or changing lesions before your next follow-up, please call to schedule a visit.  Please continue daily skin protection including broad spectrum sunscreen SPF 30+ to sun-exposed areas, reapplying every 2 hours as needed when you're outdoors.    Due to recent changes in healthcare laws, you may see results of your pathology and/or laboratory studies on MyChart before the doctors have had a chance to review them. We understand that in some cases there may be results that are confusing or concerning to you. Please understand that not all results are received at the same time and often the doctors may need to interpret multiple  results in order to provide you with the best plan of care or course of treatment. Therefore, we ask that you please give [place] 2 business days to thoroughly review all your results before contacting the office for clarification. Should we see a critical lab result, you will be contacted sooner.   If You Need Anything After Your Visit  If you have any questions or concerns for your doctor, please call our main line at [phone] and press option 4 to reach your doctor's medical assistant. If no one answers, please leave a voicemail as directed and we will return your call as soon as possible. Messages left after 4 pm will be answered the following business day.   You may also send [place] a message via MyChart. We typically respond to MyChart messages within 1-2 business days.  For prescription refills, please ask your pharmacy to contact our office. Our fax number is [phone].  If you have an urgent issue when the clinic is closed that cannot wait until the next business day, you can page your doctor at the number below.    Please note that while we do our best to be available for urgent issues outside of office hours, we are not available 24/7.   If you have an urgent issue and are unable to reach [place], you may choose to seek medical care at your doctor's office, retail clinic, urgent care  center, or emergency room.  If you have a medical emergency, please immediately call 911 or go to the emergency department.  Pager Numbers  - Dr. Gwen Pounds: [phone]  - Dr. Roseanne Reno: [phone]  - Dr. Katrinka Blazing: [phone]   In the event of inclement weather, please call our main line at [phone] for an update on the status of any delays or closures.  Dermatology Medication Tips: Please keep the boxes that topical medications come in in order to help keep track of the instructions about where and how to use these. Pharmacies typically print the medication instructions only on the boxes and not  directly on the medication tubes.   If your medication is too expensive, please contact our office at [phone] option 4 or send [place] a message through MyChart.   We are unable to tell what your co-pay for medications will be in advance as this is different depending on your insurance coverage. However, we may be able to find a substitute medication at lower cost or fill out paperwork to get insurance to cover a needed medication.   If a prior authorization is required to get your medication covered by your insurance company, please allow [place] 1-2 business days to complete this process.  Drug prices often vary depending on where the prescription is filled and some pharmacies may offer cheaper prices.  The website www.goodrx.com contains coupons for medications through different pharmacies. The prices here do not account for what the cost may be with help from insurance (it may be cheaper with your insurance), but the website can give you the price if you did not use any insurance.  - You can print the associated coupon and take it with your prescription to the pharmacy.  - You may also stop by our office during regular business hours and pick up a GoodRx coupon card.  - If you need your prescription sent electronically to a different pharmacy, notify our office through [facility] or by phone at [phone] option 4.

## 2023-01-31 NOTE — Progress Notes (Signed)
Follow-Up Visit   Subjective  Julie Skinner is a 47 y.o. female who presents for the following: Skin Cancer Screening and Full Body Skin Exam  The patient presents for Total-Body Skin Exam (TBSE) for skin cancer screening and mole check. The patient has spots, moles and lesions to be evaluated, some may be new or changing and the patient may have concern these could be cancer.  Patient with hx of BCC, dysplastic nevi. Also with hx of seb derm and tinea versicolor, uses ketoconazole shampoo as needed.   The following portions of the chart were reviewed this encounter and updated as appropriate: medications, allergies, medical history  Review of Systems:  No other skin or systemic complaints except as noted in HPI or Assessment and Plan.  Objective  Well appearing patient in no apparent distress; mood and affect are within normal limits.  A full examination was performed including scalp, head, eyes, ears, nose, lips, neck, chest, axillae, abdomen, back, buttocks, bilateral upper extremities, bilateral lower extremities, hands, feet, fingers, toes, fingernails, and toenails. All findings within normal limits unless otherwise noted below.   Relevant physical exam findings are noted in the Assessment and Plan.    Assessment & Plan   SKIN CANCER SCREENING PERFORMED TODAY.  ACTINIC DAMAGE - Chronic condition, secondary to cumulative UV/sun exposure - diffuse scaly erythematous macules with underlying dyspigmentation - Recommend daily broad spectrum sunscreen SPF 30+ to sun-exposed areas, reapply every 2 hours as needed.  - Staying in the shade or wearing long sleeves, sun glasses (UVA+UVB protection) and wide brim hats (4-inch brim around the entire circumference of the hat) are also recommended for sun protection.  - Call for new or changing lesions.  LENTIGINES, SEBORRHEIC KERATOSES, HEMANGIOMAS - Benign normal skin lesions - Benign-appearing - Call for any  changes  MELANOCYTIC NEVI - Tan-brown and/or pink-flesh-colored symmetric macules and papules - Benign appearing on exam today - Observation - Call clinic for new or changing moles - Recommend daily use of broad spectrum spf 30+ sunscreen to sun-exposed areas.   Seborrheic dermatitis Scalp Seborrheic Dermatitis  -  is a chronic persistent rash characterized by pinkness and scaling most commonly of the mid face but also can occur on the scalp (dandruff), ears; mid chest, mid back and groin.  It tends to be exacerbated by stress and cooler weather.  People who have neurologic disease may experience new onset or exacerbation of existing seborrheic dermatitis.  The condition is not curable but treatable and can be controlled.   Start Ketoconazole 2% shampoo 2-3 days per week. Let sit 5-10 minutes before washing out.   Tinea versicolor   Related Medications ketoconazole (NIZORAL) 2 % shampoo Shampoo into scalp and body let sit 10 minutes then wash off up to three days per week  History of Basal Cell Carcinoma of the Skin - No evidence of recurrence today at left lateral thigh, superficial, 2016 - Recommend regular full body skin exams - Recommend daily broad spectrum sunscreen SPF 30+ to sun-exposed areas, reapply every 2 hours as needed.  - Call if any new or changing lesions are noted between office visits   History of Dysplastic Nevi - No evidence of recurrence today at right lateral knee, left prox ant thigh - Recommend regular full body skin exams - Recommend daily broad spectrum sunscreen SPF 30+ to sun-exposed areas, reapply every 2 hours as needed.  - Call if any new or changing lesions are noted between office visits   Return in  about 1 year (around 01/31/2024) for TBSE, with Dr. Kirtland Bouchard, Hx BCC, Hx Dysplastic Nevi.  Anise Salvo, RMA, am acting as scribe for Armida Sans, MD .   Documentation: I have reviewed the above documentation for accuracy and completeness, and I  agree with the above.  Armida Sans, MD

## 2023-02-06 DIAGNOSIS — F330 Major depressive disorder, recurrent, mild: Secondary | ICD-10-CM | POA: Diagnosis not present

## 2023-02-20 DIAGNOSIS — F330 Major depressive disorder, recurrent, mild: Secondary | ICD-10-CM | POA: Diagnosis not present

## 2023-03-08 DIAGNOSIS — F330 Major depressive disorder, recurrent, mild: Secondary | ICD-10-CM | POA: Diagnosis not present

## 2023-03-24 ENCOUNTER — Other Ambulatory Visit: Payer: Self-pay | Admitting: Certified Nurse Midwife

## 2023-04-09 ENCOUNTER — Ambulatory Visit
Admission: RE | Admit: 2023-04-09 | Discharge: 2023-04-09 | Disposition: A | Discharge status: Home / Self Care | Payer: BC Managed Care – PPO | Source: Ambulatory Visit | Attending: Certified Nurse Midwife | Admitting: Certified Nurse Midwife

## 2023-04-09 DIAGNOSIS — Z01419 Encounter for gynecological examination (general) (routine) without abnormal findings: Secondary | ICD-10-CM | POA: Insufficient documentation

## 2023-04-09 DIAGNOSIS — Z1231 Encounter for screening mammogram for malignant neoplasm of breast: Secondary | ICD-10-CM | POA: Insufficient documentation

## 2023-04-17 DIAGNOSIS — F330 Major depressive disorder, recurrent, mild: Secondary | ICD-10-CM | POA: Diagnosis not present

## 2023-04-24 DIAGNOSIS — F330 Major depressive disorder, recurrent, mild: Secondary | ICD-10-CM | POA: Diagnosis not present

## 2023-05-10 DIAGNOSIS — F330 Major depressive disorder, recurrent, mild: Secondary | ICD-10-CM | POA: Diagnosis not present

## 2023-05-29 DIAGNOSIS — F330 Major depressive disorder, recurrent, mild: Secondary | ICD-10-CM | POA: Diagnosis not present

## 2023-06-04 ENCOUNTER — Encounter: Payer: Self-pay | Admitting: Certified Nurse Midwife

## 2023-06-05 ENCOUNTER — Ambulatory Visit: Payer: BC Managed Care – PPO | Admitting: Certified Nurse Midwife

## 2023-07-11 ENCOUNTER — Ambulatory Visit: Payer: BC Managed Care – PPO | Admitting: Certified Nurse Midwife

## 2023-07-26 ENCOUNTER — Other Ambulatory Visit: Payer: Self-pay | Admitting: Certified Nurse Midwife

## 2023-07-29 DIAGNOSIS — G7000 Myasthenia gravis without (acute) exacerbation: Secondary | ICD-10-CM | POA: Diagnosis not present

## 2023-07-29 DIAGNOSIS — R5383 Other fatigue: Secondary | ICD-10-CM | POA: Diagnosis not present

## 2023-07-29 DIAGNOSIS — Z79899 Other long term (current) drug therapy: Secondary | ICD-10-CM | POA: Diagnosis not present

## 2023-08-07 DIAGNOSIS — F330 Major depressive disorder, recurrent, mild: Secondary | ICD-10-CM | POA: Diagnosis not present

## 2023-08-30 DIAGNOSIS — E669 Obesity, unspecified: Secondary | ICD-10-CM | POA: Diagnosis not present

## 2023-09-01 ENCOUNTER — Other Ambulatory Visit: Payer: Self-pay | Admitting: Dermatology

## 2023-09-01 DIAGNOSIS — B360 Pityriasis versicolor: Secondary | ICD-10-CM

## 2023-09-02 DIAGNOSIS — R400 Somnolence: Secondary | ICD-10-CM | POA: Diagnosis not present

## 2023-09-02 DIAGNOSIS — R0683 Snoring: Secondary | ICD-10-CM | POA: Diagnosis not present

## 2023-09-12 DIAGNOSIS — G4730 Sleep apnea, unspecified: Secondary | ICD-10-CM | POA: Diagnosis not present

## 2023-10-03 DIAGNOSIS — F330 Major depressive disorder, recurrent, mild: Secondary | ICD-10-CM | POA: Diagnosis not present

## 2023-10-17 DIAGNOSIS — F330 Major depressive disorder, recurrent, mild: Secondary | ICD-10-CM | POA: Diagnosis not present

## 2023-10-28 ENCOUNTER — Ambulatory Visit (INDEPENDENT_AMBULATORY_CARE_PROVIDER_SITE_OTHER)

## 2023-10-28 ENCOUNTER — Other Ambulatory Visit (HOSPITAL_COMMUNITY)
Admission: RE | Admit: 2023-10-28 | Discharge: 2023-10-28 | Disposition: A | Discharge status: Home / Self Care | Source: Ambulatory Visit | Attending: Obstetrics and Gynecology | Admitting: Obstetrics and Gynecology

## 2023-10-28 VITALS — BP 120/78 | HR 76 | Ht 64.0 in | Wt 158.0 lb

## 2023-10-28 DIAGNOSIS — R102 Pelvic and perineal pain: Secondary | ICD-10-CM | POA: Diagnosis not present

## 2023-10-28 DIAGNOSIS — Z113 Encounter for screening for infections with a predominantly sexual mode of transmission: Secondary | ICD-10-CM

## 2023-10-28 DIAGNOSIS — R399 Unspecified symptoms and signs involving the genitourinary system: Secondary | ICD-10-CM

## 2023-10-28 DIAGNOSIS — N898 Other specified noninflammatory disorders of vagina: Secondary | ICD-10-CM | POA: Diagnosis not present

## 2023-10-28 LAB — POCT URINALYSIS DIPSTICK
Bilirubin, UA: NEGATIVE
Blood, UA: NEGATIVE
Glucose, UA: NEGATIVE
Ketones, UA: NEGATIVE
Spec Grav, UA: 1.01 (ref 1.010–1.025)
pH, UA: 7 (ref 5.0–8.0)

## 2023-10-28 NOTE — Progress Notes (Signed)
    NURSE VISIT NOTE  Subjective:    Patient ID: Julie Skinner, female    DOB: October 21, 1975, 48 y.o.   MRN: [id number]  HPI  Patient is a 48 y.o. G50P2002 female who presents for vaginal irritation and pressure for 2 day(s).   Objective:    BP 120/78   Pulse 76   Ht 5' 4 (1.626 m)   Wt 158 lb (71.7 kg)   BMI 27.12 kg/m     Assessment:   1. Vaginal irritation   2. Pelvic pressure in female   3. UTI symptoms   4. Screening for STD (sexually transmitted disease)       Plan:   GC and chlamydia DNA  probe sent to lab. Treatment: await results for further treatment.  ROV prn if symptoms persist or worsen.   Waddell JONELLE Maxim, CMA

## 2023-10-29 DIAGNOSIS — G4733 Obstructive sleep apnea (adult) (pediatric): Secondary | ICD-10-CM | POA: Diagnosis not present

## 2023-10-29 LAB — HEP, RPR, HIV PANEL
HIV Screen 4th Generation wRfx: NONREACTIVE
Hepatitis B Surface Ag: NEGATIVE
RPR Ser Ql: NONREACTIVE

## 2023-10-30 DIAGNOSIS — R42 Dizziness and giddiness: Secondary | ICD-10-CM | POA: Diagnosis not present

## 2023-10-30 DIAGNOSIS — R110 Nausea: Secondary | ICD-10-CM | POA: Diagnosis not present

## 2023-10-30 DIAGNOSIS — N898 Other specified noninflammatory disorders of vagina: Secondary | ICD-10-CM | POA: Diagnosis not present

## 2023-10-30 DIAGNOSIS — N132 Hydronephrosis with renal and ureteral calculous obstruction: Secondary | ICD-10-CM | POA: Diagnosis not present

## 2023-10-30 DIAGNOSIS — R109 Unspecified abdominal pain: Secondary | ICD-10-CM | POA: Diagnosis not present

## 2023-10-30 DIAGNOSIS — R1031 Right lower quadrant pain: Secondary | ICD-10-CM | POA: Diagnosis not present

## 2023-10-30 LAB — CERVICOVAGINAL ANCILLARY ONLY
Bacterial Vaginitis (gardnerella): NEGATIVE
Candida Glabrata: NEGATIVE
Candida Vaginitis: NEGATIVE
Chlamydia: NEGATIVE
Comment: NEGATIVE
Comment: NEGATIVE
Comment: NEGATIVE
Comment: NEGATIVE
Comment: NEGATIVE
Comment: NORMAL
Neisseria Gonorrhea: NEGATIVE
Trichomonas: NEGATIVE

## 2023-10-30 LAB — URINE CULTURE

## 2023-10-31 DIAGNOSIS — N132 Hydronephrosis with renal and ureteral calculous obstruction: Secondary | ICD-10-CM | POA: Diagnosis not present

## 2023-11-05 DIAGNOSIS — N3281 Overactive bladder: Secondary | ICD-10-CM | POA: Diagnosis not present

## 2023-11-05 DIAGNOSIS — N201 Calculus of ureter: Secondary | ICD-10-CM | POA: Diagnosis not present

## 2023-11-05 DIAGNOSIS — R109 Unspecified abdominal pain: Secondary | ICD-10-CM | POA: Diagnosis not present

## 2023-11-11 ENCOUNTER — Other Ambulatory Visit: Payer: Self-pay | Admitting: Certified Nurse Midwife

## 2023-11-28 DIAGNOSIS — G4733 Obstructive sleep apnea (adult) (pediatric): Secondary | ICD-10-CM | POA: Diagnosis not present

## 2023-12-25 DIAGNOSIS — G4733 Obstructive sleep apnea (adult) (pediatric): Secondary | ICD-10-CM | POA: Diagnosis not present

## 2023-12-29 DIAGNOSIS — G4733 Obstructive sleep apnea (adult) (pediatric): Secondary | ICD-10-CM | POA: Diagnosis not present

## 2024-01-09 ENCOUNTER — Ambulatory Visit (INDEPENDENT_AMBULATORY_CARE_PROVIDER_SITE_OTHER): Admitting: Certified Nurse Midwife

## 2024-01-09 ENCOUNTER — Encounter: Payer: Self-pay | Admitting: Certified Nurse Midwife

## 2024-01-09 VITALS — BP 124/75 | HR 83 | Ht 63.0 in | Wt 165.1 lb

## 2024-01-09 DIAGNOSIS — Z13220 Encounter for screening for lipoid disorders: Secondary | ICD-10-CM | POA: Diagnosis not present

## 2024-01-09 DIAGNOSIS — Z0000 Encounter for general adult medical examination without abnormal findings: Secondary | ICD-10-CM

## 2024-01-09 DIAGNOSIS — G4730 Sleep apnea, unspecified: Secondary | ICD-10-CM | POA: Diagnosis not present

## 2024-01-09 DIAGNOSIS — Z01419 Encounter for gynecological examination (general) (routine) without abnormal findings: Secondary | ICD-10-CM | POA: Diagnosis not present

## 2024-01-09 DIAGNOSIS — Z1231 Encounter for screening mammogram for malignant neoplasm of breast: Secondary | ICD-10-CM

## 2024-01-09 MED ORDER — ESTRADIOL 0.025 MG/24HR TD PTTW: 1.0000 | MEDICATED_PATCH | TRANSDERMAL | 12 refills | Status: AC

## 2024-01-09 MED ORDER — TIRZEPATIDE-WEIGHT MANAGEMENT 2.5 MG/0.5ML ~~LOC~~ SOLN: 2.5000 mg | SUBCUTANEOUS | 0 refills | Status: AC

## 2024-01-09 NOTE — Patient Instructions (Signed)
 Perimenopause: What to Know Perimenopause is the time in your life when your levels of estrogen start to go down. Estrogen is the female hormone made by your ovaries. Perimenopause can start 2-8 years before menopause. It can cause changes to your menstrual period. During this time, your ovaries may or may not make an egg. In many cases, you can still get pregnant. What are the causes? Perimenopause is a natural change in your homone levels that happens as you get older. What increases the risk? You're more likely to start perimenopause early if: You have an abnormal growth (tumor) of the pituitary gland in your brain. You have a disease that affects your ovaries. You've had certain treatments for cancer. These include: Chemotherapy. Hormone therapy. Radiation therapy on the area between your hips (pelvis). You smoke a lot or drink a lot of alcohol. Other family members have gone through menopause early. What are the signs or symptoms? Symptoms are unique to each person. You may have: Hot flashes. Irregular periods. Night sweats. Changes in how you feel about sex. You may have less of a sex drive or feel more discomfort around your sexuality. Vaginal dryness. Headaches. Mood swings. Other symptoms may include: Depression. This is when you feel sad or hopeless. Trouble sleeping. Memory problems or trouble focusing. Irritability. This means getting annoyed easily. Tiredness. Weight gain. Anxiety. This is feeling worried or nervous. You can also have trouble getting pregnant. How is this diagnosed? You may be diagnosed based on: Your medical history. An exam. Your age. Your history of menstrual periods. Your symptoms. Hormone tests. How is this treated? In some cases, no treatment is needed. Talk with your health care provider about if you should get treated. Treatments may include: Menopausal hormone therapy (MHT). Medicines to treat certain  symptoms. Acupuncture. Vitamin or herbal supplements. Before you start treatment, let your provider know if you or anyone in your family has or has had: Heart disease. Breast cancer. Blood clots. Diabetes. Osteoporosis. Follow these instructions at home: Eating and drinking  Eat a balanced diet. It should include: Fresh fruits and vegetables. Whole grains. Soybeans. Eggs. Lean meat. Low-fat dairy. To help prevent hot flashes, stay away from: Alcohol. Drinks with caffeine in them. Spicy foods. Lifestyle Do not smoke, vape, or use nicotine or tobacco. Get at least 30 minutes of physical activity on 5 or more days each week. Get 7-8 hours of sleep each night. Dress in layers that can be taken off if you have a hot flash. Find ways to manage stress. You may want to try: Deep breathing. Meditation. Writing in a journal. General instructions  Take your medicines only as told. Keep track of your periods. Track: When they happen. How heavy they are. How long they last. How much time passes between periods. Keep track of your symptoms. Track: When they start. How often you have them. How long they last. Use vaginal lubricants or moisturizers. These can help with: Vaginal dryness. Comfort during sex. You can still get pregnant if you're having any periods. Make sure you use birth control if you don't want to get pregnant. Contact a health care provider if: You have a very heavy period or pass blood clots. Your period lasts more than 2 days longer than normal. Your period comes back sooner than 21 days. You bleed after having sex. You have pain during sex. You have pain when you pee. You get very bad headaches. You have trouble with your eyesight. Get help right away if: You  have chest pain. You have trouble breathing. You have trouble talking. You have very bad depression. This information is not intended to replace advice given to you by your health care provider.  Make sure you discuss any questions you have with your health care provider. Document Revised: 12/27/2022 Document Reviewed: 12/27/2022 Elsevier Patient Education  2024 [employer].

## 2024-01-09 NOTE — Progress Notes (Signed)
 GYNECOLOGY ANNUAL PREVENTATIVE CARE ENCOUNTER NOTE  History:     Julie Skinner is a 48 y.o. G82P2002 female here for a routine annual gynecologic exam.  Current complaints: perimenoupausal symptoms, hot flashes, weight gain, she now has diagnosis of sleep apnea. She is interested in medications to help her hot flashes and weight gain.   Denies abnormal vaginal bleeding, discharge, pelvic pain, problems with intercourse or other gynecologic concerns.     Social Relationship: Married  Living: spouse and children  Work: Insurance underwriter  Exercise: few times a week  Smoke/Alcohol/drug use: denies use   Gynecologic History No LMP recorded. (Menstrual status: IUD). Contraception: IUD Last Pap: 12/05/2019. Results were: normal with negative HPV Last mammogram: 04/2023. Results were: normal  Obstetric History OB History  Gravida Para Term Preterm AB Living  2 2 2   2   SAB IAB Ectopic Multiple Live Births      2    # Outcome Date GA Lbr Len/2nd Weight Sex Type Anes PTL Lv  2 Term 06/29/07   7 lb (3.175 kg) M CS-Unspec  N LIV  1 Term 01/12/05   8 lb (3.629 kg) F CS-Unspec  N LIV     Complications: Breech extraction    Obstetric Comments  1st Menstrual Cycle: 10  1st Pregnancy:  29    Past Medical History:  Diagnosis Date   Dysplastic nevus 11/21/2020   L prox ant thigh - mild   Hx of basal cell carcinoma 12/30/2014   L lateral thigh. Superficial   Hx of dysplastic nevus 07/04/2017   R lat knee. Moderate to severe atypia, lateral margin involved. Excised 10/08/2017, margins free.   Left breast lump    Myasthenia gravis (HCC) 2007   Myasthenia gravis (HCC)    Vaginal Pap smear, abnormal     Past Surgical History:  Procedure Laterality Date   BREAST BIOPSY Left 2015?   fibroadenoma, with marker, at Dr. Fredirick office   BREAST BIOPSY Left 03/13/2019   PASH   BREAST EXCISIONAL BIOPSY Left 1999   lump removed   BREAST EXCISIONAL BIOPSY Left 1993   BENIGN    CESAREAN SECTION  7990,7993   THYMECTOMY  2007   thymetomy  2007    Current Outpatient Medications on File Prior to Visit  Medication Sig Dispense Refill   Cholecalciferol (VITAMIN D -3 PO) Take by mouth.     ketoconazole  (NIZORAL ) 2 % shampoo SHAMPOO INTO SCALP AND BODY UP TO THREE DAYS A WEEK. LET SIT FOR 10 MINUTES, THEN WASH OFF. 120 mL 11   levonorgestrel  (MIRENA ) 20 MCG/24HR IUD 1 each by Intrauterine route once.     losartan  (COZAAR ) 50 MG tablet TAKE 1 TABLET BY MOUTH DAILY 30 tablet 6   sertraline  (ZOLOFT ) 50 MG tablet TAKE 1 TABLET(50 MG) BY MOUTH DAILY 30 tablet 3   Current Facility-Administered Medications on File Prior to Visit  Medication Dose Route Frequency Provider Last Rate Last Admin   cyanocobalamin  ((VITAMIN B-12)) injection 1,000 mcg  1,000 mcg Intramuscular Once Shambley, Melody N, CNM       cyanocobalamin  ((VITAMIN B-12)) injection 1,000 mcg  1,000 mcg Intramuscular Once Shambley, Melody N, CNM        No Known Allergies  Social History:  reports that she has never smoked. She has never used smokeless tobacco. She reports current alcohol use. She reports that she does not use drugs.  Family History  Problem Relation Age of Onset  Heart disease Mother    Hypertension Mother    Heart disease Father    Hypertension Father    Breast cancer Paternal Aunt        possible 75s   Heart disease Maternal Grandmother    Cancer Maternal Grandfather        thinks prostate cancer   Breast cancer Paternal Grandmother 24    The following portions of the patient's history were reviewed and updated as appropriate: allergies, current medications, past family history, past medical history, past social history, past surgical history and problem list.  Review of Systems Pertinent items noted in HPI and remainder of comprehensive ROS otherwise negative.  Physical Exam:  BP 124/75   Pulse 83   Ht 5' 3 (1.6 m)   Wt 165 lb 1.6 oz (74.9 kg)   BMI 29.25 kg/m   CONSTITUTIONAL: Well-developed, well-nourished female in no acute distress.  HENT:  Normocephalic, atraumatic, External right and left ear normal. Oropharynx is clear and moist EYES: Conjunctivae and EOM are normal. Pupils are equal, round, and reactive to light. No scleral icterus.  NECK: Normal range of motion, supple, no masses.  Normal thyroid .  SKIN: Skin is warm and dry. No rash noted. Not diaphoretic. No erythema. No pallor. MUSCULOSKELETAL: Normal range of motion. No tenderness.  No cyanosis, clubbing, or edema.  2+ distal pulses. NEUROLOGIC: Alert and oriented to person, place, and time. Normal reflexes, muscle tone coordination.  PSYCHIATRIC: Normal mood and affect. Normal behavior. Normal judgment and thought content. CARDIOVASCULAR: Normal heart rate noted, regular rhythm RESPIRATORY: Clear to auscultation bilaterally. Effort and breath sounds normal, no problems with respiration noted. BREASTS: Symmetric in size. No masses, tenderness, skin changes, nipple drainage, or lymphadenopathy bilaterally.  ABDOMEN: Soft, no distention noted.  No tenderness, rebound or guarding.  PELVIC: Normal appearing external genitalia and urethral meatus; normal appearing vaginal mucosa and cervix.  No abnormal discharge noted.  Pap smear not due.  Strings present.  Normal uterine size, no other palpable masses, no uterine or adnexal tenderness.  .   Assessment and Plan:    Annual Well Women GYN Exam    Pap: not due  Mammogram : ordered  Cologaurd done 2023 Labs: Lipid profile  Refills: zepbound , estrogen patches Referral: none  Routine preventative health maintenance measures emphasized. Please refer to After Visit Summary for other counseling recommendations.      Zelda Hummer, CNM [facility]  [facility] [place],  [facility]

## 2024-01-10 ENCOUNTER — Other Ambulatory Visit: Payer: Self-pay | Admitting: Certified Nurse Midwife

## 2024-01-10 ENCOUNTER — Encounter: Payer: Self-pay | Admitting: Certified Nurse Midwife

## 2024-01-10 LAB — LIPID PANEL
Chol/HDL Ratio: 2.6 ratio (ref 0.0–4.4)
Cholesterol, Total: 241 mg/dL — ABNORMAL HIGH (ref 100–199)
HDL: 92 mg/dL (ref >39)
LDL Chol Calc (NIH): 140 mg/dL — ABNORMAL HIGH (ref 0–99)
Triglycerides: 54 mg/dL (ref 0–149)
VLDL Cholesterol Cal: 9 mg/dL (ref 5–40)

## 2024-01-10 NOTE — Telephone Encounter (Signed)
 Hi Julie Skinner - you saw this patient yesterday.  We received a refill request for her Losartan .  Would you like to refill?

## 2024-01-16 DIAGNOSIS — F330 Major depressive disorder, recurrent, mild: Secondary | ICD-10-CM | POA: Diagnosis not present

## 2024-02-05 DIAGNOSIS — F330 Major depressive disorder, recurrent, mild: Secondary | ICD-10-CM | POA: Diagnosis not present

## 2024-02-10 ENCOUNTER — Ambulatory Visit: Admitting: Certified Nurse Midwife

## 2024-02-10 DIAGNOSIS — F330 Major depressive disorder, recurrent, mild: Secondary | ICD-10-CM | POA: Diagnosis not present

## 2024-02-10 NOTE — Progress Notes (Unsigned)
      SUBJECTIVE:  48 y.o. here for follow-up weight loss visit, previously seen 4 weeks ago. Denies any concerns and feels like medication is not working . She does not feel like it has made any difference with her appetite. She denies any negative side effects with use of the medication. She has not lost nor gained any weight this past month. She would like to increase her dose   OBJECTIVE:  BP 129/82   Pulse 79   Ht 5' 3 (1.6 m)   Wt 165 lb (74.8 kg)   BMI 29.23 kg/m   Body mass index is 29.23 kg/m. Patient appears well.  ASSESSMENT:  Obesity- no change in weight this past month   PLAN:  To continue with current medications.   Follow up  in 4 weeks . May do vide visit   Zelda Hummer, CNM

## 2024-02-11 ENCOUNTER — Ambulatory Visit (INDEPENDENT_AMBULATORY_CARE_PROVIDER_SITE_OTHER): Admitting: Certified Nurse Midwife

## 2024-02-11 ENCOUNTER — Encounter: Payer: Self-pay | Admitting: Certified Nurse Midwife

## 2024-02-11 VITALS — BP 129/82 | HR 79 | Ht 63.0 in | Wt 165.0 lb

## 2024-02-11 DIAGNOSIS — Z7689 Persons encountering health services in other specified circumstances: Secondary | ICD-10-CM

## 2024-02-11 DIAGNOSIS — Z6829 Body mass index (BMI) 29.0-29.9, adult: Secondary | ICD-10-CM

## 2024-02-11 DIAGNOSIS — E669 Obesity, unspecified: Secondary | ICD-10-CM

## 2024-02-11 MED ORDER — TIRZEPATIDE-WEIGHT MANAGEMENT 5 MG/0.5ML ~~LOC~~ SOLN: 5.0000 mg | SUBCUTANEOUS | 0 refills | Status: DC

## 2024-02-12 ENCOUNTER — Encounter: Payer: Self-pay | Admitting: Certified Nurse Midwife

## 2024-02-13 ENCOUNTER — Telehealth: Payer: Self-pay

## 2024-02-13 ENCOUNTER — Encounter: Payer: Self-pay | Admitting: Dermatology

## 2024-02-13 ENCOUNTER — Ambulatory Visit: Payer: BC Managed Care – PPO | Admitting: Dermatology

## 2024-02-13 DIAGNOSIS — Z79899 Other long term (current) drug therapy: Secondary | ICD-10-CM

## 2024-02-13 DIAGNOSIS — D229 Melanocytic nevi, unspecified: Secondary | ICD-10-CM

## 2024-02-13 DIAGNOSIS — Z85828 Personal history of other malignant neoplasm of skin: Secondary | ICD-10-CM

## 2024-02-13 DIAGNOSIS — B360 Pityriasis versicolor: Secondary | ICD-10-CM

## 2024-02-13 DIAGNOSIS — L578 Other skin changes due to chronic exposure to nonionizing radiation: Secondary | ICD-10-CM

## 2024-02-13 DIAGNOSIS — Z1283 Encounter for screening for malignant neoplasm of skin: Secondary | ICD-10-CM | POA: Diagnosis not present

## 2024-02-13 DIAGNOSIS — L814 Other melanin hyperpigmentation: Secondary | ICD-10-CM

## 2024-02-13 DIAGNOSIS — Z7189 Other specified counseling: Secondary | ICD-10-CM

## 2024-02-13 DIAGNOSIS — Z86018 Personal history of other benign neoplasm: Secondary | ICD-10-CM

## 2024-02-13 DIAGNOSIS — L219 Seborrheic dermatitis, unspecified: Secondary | ICD-10-CM

## 2024-02-13 DIAGNOSIS — L821 Other seborrheic keratosis: Secondary | ICD-10-CM

## 2024-02-13 DIAGNOSIS — F330 Major depressive disorder, recurrent, mild: Secondary | ICD-10-CM | POA: Diagnosis not present

## 2024-02-13 DIAGNOSIS — L905 Scar conditions and fibrosis of skin: Secondary | ICD-10-CM

## 2024-02-13 DIAGNOSIS — W908XXA Exposure to other nonionizing radiation, initial encounter: Secondary | ICD-10-CM

## 2024-02-13 DIAGNOSIS — D1801 Hemangioma of skin and subcutaneous tissue: Secondary | ICD-10-CM

## 2024-02-13 MED ORDER — KETOCONAZOLE 2 % EX SHAM: MEDICATED_SHAMPOO | CUTANEOUS | 11 refills | Status: AC

## 2024-02-13 NOTE — Telephone Encounter (Signed)
 Prior authorization submitted.  Clinical questions answered.  Most recent clinic note provided.

## 2024-02-13 NOTE — Patient Instructions (Signed)
 Recommend daily broad spectrum sunscreen SPF 30+ to sun-exposed areas, reapply every 2 hours as needed. Call for new or changing lesions.  Staying in the shade or wearing long sleeves, sun glasses (UVA+UVB protection) and wide brim hats (4-inch brim around the entire circumference of the hat) are also recommended for sun protection.     Melanoma ABCDEs  Melanoma is the most dangerous type of skin cancer, and is the leading cause of death from skin disease.  You are more likely to develop melanoma if you: Have light-colored skin, light-colored eyes, or red or blond hair Spend a lot of time in the sun Tan regularly, either outdoors or in a tanning bed Have had blistering sunburns, especially during childhood Have a close family member who has had a melanoma Have atypical moles or large birthmarks  Early detection of melanoma is key since treatment is typically straightforward and cure rates are extremely high if we catch it early.   The first sign of melanoma is often a change in a mole or a new dark spot.  The ABCDE system is a way of remembering the signs of melanoma.  A for asymmetry:  The two halves do not match. B for border:  The edges of the growth are irregular. C for color:  A mixture of colors are present instead of an even brown color. D for diameter:  Melanomas are usually (but not always) greater than 6mm - the size of a pencil eraser. E for evolution:  The spot keeps changing in size, shape, and color.  Please check your skin once per month between visits. You can use a small mirror in front and a large mirror behind you to keep an eye on the back side or your body.   If you see any new or changing lesions before your next follow-up, please call to schedule a visit.  Please continue daily skin protection including broad spectrum sunscreen SPF 30+ to sun-exposed areas, reapplying every 2 hours as needed when you're outdoors.    Due to recent changes in healthcare laws, you  may see results of your pathology and/or laboratory studies on MyChart before the doctors have had a chance to review them. We understand that in some cases there may be results that are confusing or concerning to you. Please understand that not all results are received at the same time and often the doctors may need to interpret multiple results in order to provide you with the best plan of care or course of treatment. Therefore, we ask that you please give us  2 business days to thoroughly review all your results before contacting the office for clarification. Should we see a critical lab result, you will be contacted sooner.   If You Need Anything After Your Visit  If you have any questions or concerns for your doctor, please call our main line at [phone] and press option 4 to reach your doctor's medical assistant. If no one answers, please leave a voicemail as directed and we will return your call as soon as possible. Messages left after 4 pm will be answered the following business day.   You may also send us  a message via MyChart. We typically respond to MyChart messages within 1-2 business days.  For prescription refills, please ask your pharmacy to contact our office. Our fax number is [phone].  If you have an urgent issue when the clinic is closed that cannot wait until the next business day, you can page your doctor at  the number below.    Please note that while we do our best to be available for urgent issues outside of office hours, we are not available 24/7.   If you have an urgent issue and are unable to reach us , you may choose to seek medical care at your doctor's office, retail clinic, urgent care center, or emergency room.  If you have a medical emergency, please immediately call 911 or go to the emergency department.  Pager Numbers  - Dr. Hester: [phone]  - Dr. Jackquline: [phone]  - Dr. Claudene: [phone]   - Dr. Raymund: [phone]  In the event of  inclement weather, please call our main line at [phone] for an update on the status of any delays or closures.  Dermatology Medication Tips: Please keep the boxes that topical medications come in in order to help keep track of the instructions about where and how to use these. Pharmacies typically print the medication instructions only on the boxes and not directly on the medication tubes.   If your medication is too expensive, please contact our office at [phone] option 4 or send us  a message through MyChart.   We are unable to tell what your co-pay for medications will be in advance as this is different depending on your insurance coverage. However, we may be able to find a substitute medication at lower cost or fill out paperwork to get insurance to cover a needed medication.   If a prior authorization is required to get your medication covered by your insurance company, please allow us  1-2 business days to complete this process.  Drug prices often vary depending on where the prescription is filled and some pharmacies may offer cheaper prices.  The website www.goodrx.com contains coupons for medications through different pharmacies. The prices here do not account for what the cost may be with help from insurance (it may be cheaper with your insurance), but the website can give you the price if you did not use any insurance.  - You can print the associated coupon and take it with your prescription to the pharmacy.  - You may also stop by our office during regular business hours and pick up a GoodRx coupon card.  - If you need your prescription sent electronically to a different pharmacy, notify our office through [facility] or by phone at [phone] option 4.     Si Usted Necesita Algo Despus de Su Visita  Tambin puede enviarnos un mensaje a travs de Clinical cytogeneticist. Por lo general respondemos a los mensajes de MyChart en el transcurso de 1 a 2 das hbiles.  Para renovar  recetas, por favor pida a su farmacia que se ponga en contacto con nuestra oficina. Randi lakes de fax es [place] [phone].  Si tiene un asunto urgente cuando la clnica est cerrada y que no puede esperar hasta el siguiente da hbil, puede llamar/localizar a su doctor(a) al nmero que aparece a continuacin.   Por favor, tenga en cuenta que aunque hacemos todo lo posible para estar disponibles para asuntos urgentes fuera del horario de [place], no estamos disponibles las 24 horas del da, los 7 [address]  [address] de la [place].   Si tiene un problema urgente y no puede comunicarse con nosotros, puede optar por buscar atencin mdica  en el consultorio de su doctor(a), en una clnica privada, en un centro de atencin urgente o en una sala de emergencias.  Si tiene una emergencia mdica, por favor llame inmediatamente al 911 o vaya a  la sala de emergencias.  Nmeros de bper  - Dr. Hester: [phone]  - Dra. Jackquline: [phone]  - Dr. Claudene: [phone]  - Dra. Kitts: [phone]  En caso de inclemencias del [place], por favor llame a nuestra lnea principal al [phone] para una actualizacin sobre el estado de cualquier retraso o cierre.  Consejos para la medicacin en dermatologa: Por favor, guarde las cajas en las que vienen los medicamentos de uso tpico para ayudarle a seguir las instrucciones sobre dnde y cmo usarlos. Las farmacias generalmente imprimen las instrucciones del medicamento slo en las cajas y no directamente en los tubos del [place].   Si su medicamento es muy caro, por favor, pngase en contacto con landry rieger llamando al [phone] y presione la opcin 4 o envenos un mensaje a travs de Clinical cytogeneticist.   No podemos decirle cul ser su copago por los medicamentos por adelantado ya que esto es diferente dependiendo de la cobertura de su seguro. Sin embargo, es posible que podamos encontrar un medicamento sustituto a Audiological scientist un formulario para que el  seguro cubra el medicamento que se considera necesario.   Si se requiere una autorizacin previa para que su compaa de seguros malta su medicamento, por favor permtanos de 1 a 2 das hbiles para completar este proceso.  Los precios de los medicamentos varan con frecuencia dependiendo del Environmental consultant de dnde se surte la receta y alguna farmacias pueden ofrecer precios ms baratos.  El sitio web www.goodrx.com tiene cupones para medicamentos de Health and safety inspector. Los precios aqu no tienen en cuenta lo que podra costar con la ayuda del seguro (puede ser ms barato con su seguro), pero el sitio web puede darle el precio si no utiliz Tourist information centre manager.  - Puede imprimir el cupn correspondiente y llevarlo con su receta a la farmacia.  - Tambin puede pasar por nuestra oficina durante el horario de atencin regular y Education officer, museum una tarjeta de cupones de GoodRx.  - Si necesita que su receta se enve electrnicamente a una farmacia diferente, informe a nuestra oficina a travs de MyChart de [place] o por telfono llamando al [phone] y presione la opcin 4.

## 2024-02-13 NOTE — Progress Notes (Signed)
 Follow-Up Visit   Subjective  Julie Skinner is a 48 y.o. female who presents for the following: Skin Cancer Screening and Full Body Skin Exam; hx of BCC and dysplastic nevi. Patient reports no areas of concern today.   The patient presents for Total-Body Skin Exam (TBSE) for skin cancer screening and mole check. The patient has spots, moles and lesions to be evaluated, some may be new or changing and the patient may have concern these could be cancer.  The following portions of the chart were reviewed this encounter and updated as appropriate: medications, allergies, medical history  Review of Systems:  No other skin or systemic complaints except as noted in HPI or Assessment and Plan.  Objective  Well appearing patient in no apparent distress; mood and affect are within normal limits.  A full examination was performed including scalp, head, eyes, ears, nose, lips, neck, chest, axillae, abdomen, back, buttocks, bilateral upper extremities, bilateral lower extremities, hands, feet, fingers, toes, fingernails, and toenails. All findings within normal limits unless otherwise noted below.   Relevant physical exam findings are noted in the Assessment and Plan.    Assessment & Plan   SKIN CANCER SCREENING PERFORMED TODAY.  ACTINIC DAMAGE - Chronic condition, secondary to cumulative UV/sun exposure - diffuse scaly erythematous macules with underlying dyspigmentation - Recommend daily broad spectrum sunscreen SPF 30+ to sun-exposed areas, reapply every 2 hours as needed.  - Staying in the shade or wearing long sleeves, sun glasses (UVA+UVB protection) and wide brim hats (4-inch brim around the entire circumference of the hat) are also recommended for sun protection.  - Call for new or changing lesions.  LENTIGINES, SEBORRHEIC KERATOSES, HEMANGIOMAS - Benign normal skin lesions - Benign-appearing - Call for any changes  MELANOCYTIC NEVI - Tan-brown and/or pink-flesh-colored  symmetric macules and papules - Benign appearing on exam today - Observation - Call clinic for new or changing moles - Recommend daily use of broad spectrum spf 30+ sunscreen to sun-exposed areas.   Seborrheic dermatitis Scalp Chronic condition with duration or expected duration over one year. Currently well-controlled. Seborrheic Dermatitis  -  is a chronic persistent rash characterized by pinkness and scaling most commonly of the mid face but also can occur on the scalp (dandruff), ears; mid chest, mid back and groin.  It tends to be exacerbated by stress and cooler weather.  People who have neurologic disease may experience new onset or exacerbation of existing seborrheic dermatitis.  The condition is not curable but treatable and can be controlled. Continue Ketoconazole  2% shampoo 2-3 days per week. Let sit 5-10 minutes before washing out.   Tinea versicolor  Chronic condition with duration or expected duration over one year. Currently well-controlled. Related Medications Continue ketoconazole  (NIZORAL ) 2 % shampoo; shampoo into scalp and body let sit 10 minutes then wash off up to three days per week   History of Basal Cell Carcinoma of the Skin - No evidence of recurrence today at left lateral thigh, superficial, 2016 - Recommend regular full body skin exams - Recommend daily broad spectrum sunscreen SPF 30+ to sun-exposed areas, reapply every 2 hours as needed.  - Call if any new or changing lesions are noted between office visits   History of Dysplastic Nevi - No evidence of recurrence today at right lateral knee, left prox ant thigh - Recommend regular full body skin exams - Recommend daily broad spectrum sunscreen SPF 30+ to sun-exposed areas, reapply every 2 hours as needed.  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  Observation.  Call clinic for new or  changing lesions. Recommend daily broad spectrum sunscreen SPF 30+, reapply every 2 hours as needed. Treatment: Recommend Serica moisturizing scar formula cream every night or [facility] brand or Mederma silicone scar sheet every night for the first year after a scar appears to help with scar remodeling if desired. Scars remodel on their own for a full year and will gradually improve in appearance over time.  TINEA VERSICOLOR   Related Medications ketoconazole  (NIZORAL ) 2 % shampoo SHAMPOO INTO SCALP AND BODY UP TO THREE DAYS A WEEK. LET SIT FOR 10 MINUTES, THEN WASH OFF. Return in about 1 year (around 02/12/2025) for TBSE; Hx of BCC, Dysplastic Nevi.  I, Emerick Ege, CMA am acting as scribe for Alm Rhyme, MD.   Documentation: I have reviewed the above documentation for accuracy and completeness, and I agree with the above.  Alm Rhyme, MD

## 2024-02-14 ENCOUNTER — Other Ambulatory Visit: Payer: Self-pay

## 2024-02-14 NOTE — Telephone Encounter (Signed)
 Prior Auth was declined per insurance.

## 2024-02-18 ENCOUNTER — Other Ambulatory Visit: Payer: Self-pay | Admitting: Certified Nurse Midwife

## 2024-02-18 MED ORDER — ZEPBOUND 5 MG/0.5ML ~~LOC~~ SOAJ: 5.0000 mg | SUBCUTANEOUS | 0 refills | Status: DC

## 2024-03-09 ENCOUNTER — Encounter: Payer: Self-pay | Admitting: Certified Nurse Midwife

## 2024-03-10 ENCOUNTER — Other Ambulatory Visit: Payer: Self-pay | Admitting: Certified Nurse Midwife

## 2024-03-10 MED ORDER — TIRZEPATIDE-WEIGHT MANAGEMENT 7.5 MG/0.5ML ~~LOC~~ SOLN: 7.5000 mg | SUBCUTANEOUS | 0 refills | Status: DC

## 2024-03-15 ENCOUNTER — Other Ambulatory Visit: Payer: Self-pay | Admitting: Certified Nurse Midwife

## 2024-03-16 DIAGNOSIS — F330 Major depressive disorder, recurrent, mild: Secondary | ICD-10-CM | POA: Diagnosis not present

## 2024-03-17 DIAGNOSIS — J209 Acute bronchitis, unspecified: Secondary | ICD-10-CM | POA: Diagnosis not present

## 2024-03-17 DIAGNOSIS — J019 Acute sinusitis, unspecified: Secondary | ICD-10-CM | POA: Diagnosis not present

## 2024-03-19 ENCOUNTER — Telehealth: Admitting: Certified Nurse Midwife

## 2024-03-19 DIAGNOSIS — Z7689 Persons encountering health services in other specified circumstances: Secondary | ICD-10-CM

## 2024-03-19 DIAGNOSIS — E669 Obesity, unspecified: Secondary | ICD-10-CM | POA: Diagnosis not present

## 2024-03-19 DIAGNOSIS — Z713 Dietary counseling and surveillance: Secondary | ICD-10-CM

## 2024-03-19 NOTE — Progress Notes (Addendum)
    Virtual Visit via Telephone Note   I connected with Julie Skinner on 03/19/24 at  7:35 AM EST by telephone and verified that I am speaking with the correct person using two identifiers.   Location: Patient: at home Provider: at work   I discussed the limitations, risks, security and privacy concerns of performing an evaluation and management service by telephone and the availability of in person appointments. I also discussed with the patient that there may be a patient responsible charge related to this service. The patient expressed understanding and agreed to proceed   SUBJECTIVE:  48 y.o. here for follow-up weight loss visit, previously seen 4 weeks ago. Denies any concerns and feels like medication is working. She states she definitely notices a different in her appetite since starting the 7.5 mg dose. She state she started it this Sunday.   OBJECTIVE:  vitals: virtual visit  Patient appears well. ASSESSMENT:  Obesity- responding well to weight loss plan PLAN:  Pt to reach out in 3 weeks to discuss if she would like to go up or stay at the same dose.  Follow up 3 weeks with my chart message in regards to dosage.   Zelda Hummer, CNM 7:54 AM

## 2024-03-31 ENCOUNTER — Encounter: Payer: Self-pay | Admitting: Certified Nurse Midwife

## 2024-03-31 ENCOUNTER — Other Ambulatory Visit: Payer: Self-pay

## 2024-03-31 MED ORDER — LOSARTAN POTASSIUM 50 MG PO TABS: 50.0000 mg | ORAL_TABLET | Freq: Every day | ORAL | 3 refills | Status: AC

## 2024-04-07 ENCOUNTER — Encounter: Payer: Self-pay | Admitting: Certified Nurse Midwife

## 2024-04-07 ENCOUNTER — Other Ambulatory Visit: Payer: Self-pay | Admitting: Certified Nurse Midwife

## 2024-04-07 MED ORDER — TIRZEPATIDE-WEIGHT MANAGEMENT 7.5 MG/0.5ML ~~LOC~~ SOLN: 7.5000 mg | SUBCUTANEOUS | 1 refills | Status: DC

## 2024-04-08 DIAGNOSIS — F330 Major depressive disorder, recurrent, mild: Secondary | ICD-10-CM | POA: Diagnosis not present

## 2024-04-13 ENCOUNTER — Other Ambulatory Visit: Payer: Self-pay | Admitting: Certified Nurse Midwife

## 2024-04-14 ENCOUNTER — Ambulatory Visit
Admission: RE | Admit: 2024-04-14 | Discharge: 2024-04-14 | Disposition: A | Source: Ambulatory Visit | Attending: Certified Nurse Midwife

## 2024-04-14 DIAGNOSIS — Z1231 Encounter for screening mammogram for malignant neoplasm of breast: Secondary | ICD-10-CM | POA: Diagnosis not present

## 2024-04-14 DIAGNOSIS — Z0000 Encounter for general adult medical examination without abnormal findings: Secondary | ICD-10-CM

## 2024-04-23 DIAGNOSIS — F330 Major depressive disorder, recurrent, mild: Secondary | ICD-10-CM | POA: Diagnosis not present

## 2024-05-11 ENCOUNTER — Encounter: Payer: Self-pay | Admitting: Certified Nurse Midwife

## 2024-05-12 ENCOUNTER — Other Ambulatory Visit: Payer: Self-pay | Admitting: Certified Nurse Midwife

## 2024-05-12 MED ORDER — TIRZEPATIDE-WEIGHT MANAGEMENT 7.5 MG/0.5ML ~~LOC~~ SOLN: 7.5000 mg | SUBCUTANEOUS | 1 refills | Status: AC

## 2025-02-23 ENCOUNTER — Ambulatory Visit: Admitting: Dermatology
